# Patient Record
Sex: Female | Born: 1985 | Race: Asian | Hispanic: No | Marital: Married | Smoking: Never smoker
Health system: Southern US, Community
[De-identification: ages and names within clinical notes are randomized; demographics above are authoritative.]

## PROBLEM LIST (undated history)

## (undated) DIAGNOSIS — I10 Essential (primary) hypertension: Secondary | ICD-10-CM

## (undated) DIAGNOSIS — O24419 Gestational diabetes mellitus in pregnancy, unspecified control: Secondary | ICD-10-CM

## (undated) DIAGNOSIS — B999 Unspecified infectious disease: Secondary | ICD-10-CM

## (undated) HISTORY — DX: Essential (primary) hypertension: I10

---

## 2018-11-11 DIAGNOSIS — I10 Essential (primary) hypertension: Secondary | ICD-10-CM | POA: Insufficient documentation

## 2018-12-27 ENCOUNTER — Ambulatory Visit (INDEPENDENT_AMBULATORY_CARE_PROVIDER_SITE_OTHER): Payer: Medicaid Other | Admitting: Obstetrics and Gynecology

## 2018-12-27 ENCOUNTER — Encounter: Payer: Self-pay | Admitting: Obstetrics and Gynecology

## 2018-12-27 DIAGNOSIS — O10919 Unspecified pre-existing hypertension complicating pregnancy, unspecified trimester: Secondary | ICD-10-CM | POA: Insufficient documentation

## 2018-12-27 DIAGNOSIS — Z23 Encounter for immunization: Secondary | ICD-10-CM

## 2018-12-27 DIAGNOSIS — O10911 Unspecified pre-existing hypertension complicating pregnancy, first trimester: Secondary | ICD-10-CM | POA: Diagnosis not present

## 2018-12-27 DIAGNOSIS — O0991 Supervision of high risk pregnancy, unspecified, first trimester: Secondary | ICD-10-CM

## 2018-12-27 DIAGNOSIS — Z3481 Encounter for supervision of other normal pregnancy, first trimester: Secondary | ICD-10-CM

## 2018-12-27 DIAGNOSIS — O0990 Supervision of high risk pregnancy, unspecified, unspecified trimester: Secondary | ICD-10-CM

## 2018-12-27 MED ORDER — PROGESTERONE MICRONIZED 200 MG PO CAPS: 200.0000 mg | ORAL_CAPSULE | Freq: Every day | ORAL | 0 refills | Status: DC

## 2018-12-27 MED ORDER — LABETALOL HCL 100 MG PO TABS: 100.0000 mg | ORAL_TABLET | Freq: Two times a day (BID) | ORAL | 4 refills | Status: DC

## 2018-12-27 MED ORDER — ASPIRIN EC 81 MG PO TBEC: 81.0000 mg | DELAYED_RELEASE_TABLET | Freq: Every day | ORAL | 2 refills | Status: DC

## 2018-12-27 MED ORDER — PRENATAL 27-0.8 MG PO TABS: 1.0000 | ORAL_TABLET | Freq: Every day | ORAL | 11 refills | Status: AC

## 2018-12-27 NOTE — Patient Instructions (Signed)
 First Trimester of Pregnancy The first trimester of pregnancy is from week 1 until the end of week 13 (months 1 through 3). A week after a sperm fertilizes an egg, the egg will implant on the wall of the uterus. This embryo will begin to develop into a baby. Genes from you and your partner will form the baby. The female genes will determine whether the baby will be a boy or a girl. At 6-8 weeks, the eyes and face will be formed, and the heartbeat can be seen on ultrasound. At the end of 12 weeks, all the baby's organs will be formed. Now that you are pregnant, you will want to do everything you can to have a healthy baby. Two of the most important things are to get good prenatal care and to follow your health care provider's instructions. Prenatal care is all the medical care you receive before the baby's birth. This care will help prevent, find, and treat any problems during the pregnancy and childbirth. Body changes during your first trimester Your body goes through many changes during pregnancy. The changes vary from woman to woman.  You may gain or lose a couple of pounds at first.  You may feel sick to your stomach (nauseous) and you may throw up (vomit). If the vomiting is uncontrollable, call your health care provider.  You may tire easily.  You may develop headaches that can be relieved by medicines. All medicines should be approved by your health care provider.  You may urinate more often. Painful urination may mean you have a bladder infection.  You may develop heartburn as a result of your pregnancy.  You may develop constipation because certain hormones are causing the muscles that push stool through your intestines to slow down.  You may develop hemorrhoids or swollen veins (varicose veins).  Your breasts may begin to grow larger and become tender. Your nipples may stick out more, and the tissue that surrounds them (areola) may become darker.  Your gums may bleed and may be  sensitive to brushing and flossing.  Dark spots or blotches (chloasma, mask of pregnancy) may develop on your face. This will likely fade after the baby is born.  Your menstrual periods will stop.  You may have a loss of appetite.  You may develop cravings for certain kinds of food.  You may have changes in your emotions from day to day, such as being excited to be pregnant or being concerned that something may go wrong with the pregnancy and baby.  You may have more vivid and strange dreams.  You may have changes in your hair. These can include thickening of your hair, rapid growth, and changes in texture. Some women also have hair loss during or after pregnancy, or hair that feels dry or thin. Your hair will most likely return to normal after your baby is born. What to expect at prenatal visits During a routine prenatal visit:  You will be weighed to make sure you and the baby are growing normally.  Your blood pressure will be taken.  Your abdomen will be measured to track your baby's growth.  The fetal heartbeat will be listened to between weeks 10 and 14 of your pregnancy.  Test results from any previous visits will be discussed. Your health care provider may ask you:  How you are feeling.  If you are feeling the baby move.  If you have had any abnormal symptoms, such as leaking fluid, bleeding, severe headaches, or   abdominal cramping.  If you are using any tobacco products, including cigarettes, chewing tobacco, and electronic cigarettes.  If you have any questions. Other tests that may be performed during your first trimester include:  Blood tests to find your blood type and to check for the presence of any previous infections. The tests will also be used to check for low iron levels (anemia) and protein on red blood cells (Rh antibodies). Depending on your risk factors, or if you previously had diabetes during pregnancy, you may have tests to check for high blood sugar  that affects pregnant women (gestational diabetes).  Urine tests to check for infections, diabetes, or protein in the urine.  An ultrasound to confirm the proper growth and development of the baby.  Fetal screens for spinal cord problems (spina bifida) and Down syndrome.  HIV (human immunodeficiency virus) testing. Routine prenatal testing includes screening for HIV, unless you choose not to have this test.  You may need other tests to make sure you and the baby are doing well. Follow these instructions at home: Medicines  Follow your health care provider's instructions regarding medicine use. Specific medicines may be either safe or unsafe to take during pregnancy.  Take a prenatal vitamin that contains at least 600 micrograms (mcg) of folic acid.  If you develop constipation, try taking a stool softener if your health care provider approves. Eating and drinking   Eat a balanced diet that includes fresh fruits and vegetables, whole grains, good sources of protein such as meat, eggs, or tofu, and low-fat dairy. Your health care provider will help you determine the amount of weight gain that is right for you.  Avoid raw meat and uncooked cheese. These carry germs that can cause birth defects in the baby.  Eating four or five small meals rather than three large meals a day may help relieve nausea and vomiting. If you start to feel nauseous, eating a few soda crackers can be helpful. Drinking liquids between meals, instead of during meals, also seems to help ease nausea and vomiting.  Limit foods that are high in fat and processed sugars, such as fried and sweet foods.  To prevent constipation: ? Eat foods that are high in fiber, such as fresh fruits and vegetables, whole grains, and beans. ? Drink enough fluid to keep your urine clear or pale yellow. Activity  Exercise only as directed by your health care provider. Most women can continue their usual exercise routine during  pregnancy. Try to exercise for 30 minutes at least 5 days a week. Exercising will help you: ? Control your weight. ? Stay in shape. ? Be prepared for labor and delivery.  Experiencing pain or cramping in the lower abdomen or lower back is a good sign that you should stop exercising. Check with your health care provider before continuing with normal exercises.  Try to avoid standing for long periods of time. Move your legs often if you must stand in one place for a long time.  Avoid heavy lifting.  Wear low-heeled shoes and practice good posture.  You may continue to have sex unless your health care provider tells you not to. Relieving pain and discomfort  Wear a good support bra to relieve breast tenderness.  Take warm sitz baths to soothe any pain or discomfort caused by hemorrhoids. Use hemorrhoid cream if your health care provider approves.  Rest with your legs elevated if you have leg cramps or low back pain.  If you develop varicose veins   in your legs, wear support hose. Elevate your feet for 15 minutes, 3-4 times a day. Limit salt in your diet. Prenatal care  Schedule your prenatal visits by the twelfth week of pregnancy. They are usually scheduled monthly at first, then more often in the last 2 months before delivery.  Write down your questions. Take them to your prenatal visits.  Keep all your prenatal visits as told by your health care provider. This is important. Safety  Wear your seat belt at all times when driving.  Make a list of emergency phone numbers, including numbers for family, friends, the hospital, and police and fire departments. General instructions  Ask your health care provider for a referral to a local prenatal education class. Begin classes no later than the beginning of month 6 of your pregnancy.  Ask for help if you have counseling or nutritional needs during pregnancy. Your health care provider can offer advice or refer you to specialists for help  with various needs.  Do not use hot tubs, steam rooms, or saunas.  Do not douche or use tampons or scented sanitary pads.  Do not cross your legs for long periods of time.  Avoid cat litter boxes and soil used by cats. These carry germs that can cause birth defects in the baby and possibly loss of the fetus by miscarriage or stillbirth.  Avoid all smoking, herbs, alcohol, and medicines not prescribed by your health care provider. Chemicals in these products affect the formation and growth of the baby.  Do not use any products that contain nicotine or tobacco, such as cigarettes and e-cigarettes. If you need help quitting, ask your health care provider. You may receive counseling support and other resources to help you quit.  Schedule a dentist appointment. At home, brush your teeth with a soft toothbrush and be gentle when you floss. Contact a health care provider if:  You have dizziness.  You have mild pelvic cramps, pelvic pressure, or nagging pain in the abdominal area.  You have persistent nausea, vomiting, or diarrhea.  You have a bad smelling vaginal discharge.  You have pain when you urinate.  You notice increased swelling in your face, hands, legs, or ankles.  You are exposed to fifth disease or chickenpox.  You are exposed to [place] measles (rubella) and have never had it. Get help right away if:  You have a fever.  You are leaking fluid from your vagina.  You have spotting or bleeding from your vagina.  You have severe abdominal cramping or pain.  You have rapid weight gain or loss.  You vomit blood or material that looks like coffee grounds.  You develop a severe headache.  You have shortness of breath.  You have any kind of trauma, such as from a fall or a car accident. Summary  The first trimester of pregnancy is from week 1 until the end of week 13 (months 1 through 3).  Your body goes through many changes during pregnancy. The changes vary from  woman to woman.  You will have routine prenatal visits. During those visits, your health care provider will examine you, discuss any test results you may have, and talk with you about how you are feeling. This information is not intended to replace advice given to you by your health care provider. Make sure you discuss any questions you have with your health care provider. Document Released: 11/18/2001 Document Revised: 11/05/2016 Document Reviewed: 11/05/2016 Elsevier Interactive Patient Education  2019 [employer].  Second Trimester of Pregnancy The second trimester is from week 14 through week 27 (months 4 through 6). The second trimester is often a time when you feel your best. Your body has adjusted to being pregnant, and you begin to feel better physically. Usually, morning sickness has lessened or quit completely, you may have more energy, and you may have an increase in appetite. The second trimester is also a time when the fetus is growing rapidly. At the end of the sixth month, the fetus is about 9 inches long and weighs about 1 pounds. You will likely begin to feel the baby move (quickening) between 16 and 20 weeks of pregnancy. Body changes during your second trimester Your body continues to go through many changes during your second trimester. The changes vary from woman to woman.  Your weight will continue to increase. You will notice your lower abdomen bulging out.  You may begin to get stretch marks on your hips, abdomen, and breasts.  You may develop headaches that can be relieved by medicines. The medicines should be approved by your health care provider.  You may urinate more often because the fetus is pressing on your bladder.  You may develop or continue to have heartburn as a result of your pregnancy.  You may develop constipation because certain hormones are causing the muscles that push waste through your intestines to slow down.  You may develop hemorrhoids or  swollen, bulging veins (varicose veins).  You may have back pain. This is caused by: ? Weight gain. ? Pregnancy hormones that are relaxing the joints in your pelvis. ? A shift in weight and the muscles that support your balance.  Your breasts will continue to grow and they will continue to become tender.  Your gums may bleed and may be sensitive to brushing and flossing.  Dark spots or blotches (chloasma, mask of pregnancy) may develop on your face. This will likely fade after the baby is born.  A dark line from your belly button to the pubic area (linea nigra) may appear. This will likely fade after the baby is born.  You may have changes in your hair. These can include thickening of your hair, rapid growth, and changes in texture. Some women also have hair loss during or after pregnancy, or hair that feels dry or thin. Your hair will most likely return to normal after your baby is born. What to expect at prenatal visits During a routine prenatal visit:  You will be weighed to make sure you and the fetus are growing normally.  Your blood pressure will be taken.  Your abdomen will be measured to track your baby's growth.  The fetal heartbeat will be listened to.  Any test results from the previous visit will be discussed. Your health care provider may ask you:  How you are feeling.  If you are feeling the baby move.  If you have had any abnormal symptoms, such as leaking fluid, bleeding, severe headaches, or abdominal cramping.  If you are using any tobacco products, including cigarettes, chewing tobacco, and electronic cigarettes.  If you have any questions. Other tests that may be performed during your second trimester include:  Blood tests that check for: ? Low iron levels (anemia). ? High blood sugar that affects pregnant women (gestational diabetes) between 26 and 28 weeks. ? Rh antibodies. This is to check for a protein on red blood cells (Rh factor).  Urine tests  to check for infections, diabetes, or protein in  the urine.  An ultrasound to confirm the proper growth and development of the baby.  An amniocentesis to check for possible genetic problems.  Fetal screens for spina bifida and Down syndrome.  HIV (human immunodeficiency virus) testing. Routine prenatal testing includes screening for HIV, unless you choose not to have this test. Follow these instructions at home: Medicines  Follow your health care provider's instructions regarding medicine use. Specific medicines may be either safe or unsafe to take during pregnancy.  Take a prenatal vitamin that contains at least 600 micrograms (mcg) of folic acid.  If you develop constipation, try taking a stool softener if your health care provider approves. Eating and drinking   Eat a balanced diet that includes fresh fruits and vegetables, whole grains, good sources of protein such as meat, eggs, or tofu, and low-fat dairy. Your health care provider will help you determine the amount of weight gain that is right for you.  Avoid raw meat and uncooked cheese. These carry germs that can cause birth defects in the baby.  If you have low calcium intake from food, talk to your health care provider about whether you should take a daily calcium supplement.  Limit foods that are high in fat and processed sugars, such as fried and sweet foods.  To prevent constipation: ? Drink enough fluid to keep your urine clear or pale yellow. ? Eat foods that are high in fiber, such as fresh fruits and vegetables, whole grains, and beans. Activity  Exercise only as directed by your health care provider. Most women can continue their usual exercise routine during pregnancy. Try to exercise for 30 minutes at least 5 days a week. Stop exercising if you experience uterine contractions.  Avoid heavy lifting, wear low heel shoes, and practice good posture.  A sexual relationship may be continued unless your health care  provider directs you otherwise. Relieving pain and discomfort  Wear a good support bra to prevent discomfort from breast tenderness.  Take warm sitz baths to soothe any pain or discomfort caused by hemorrhoids. Use hemorrhoid cream if your health care provider approves.  Rest with your legs elevated if you have leg cramps or low back pain.  If you develop varicose veins, wear support hose. Elevate your feet for 15 minutes, 3-4 times a day. Limit salt in your diet. Prenatal Care  Write down your questions. Take them to your prenatal visits.  Keep all your prenatal visits as told by your health care provider. This is important. Safety  Wear your seat belt at all times when driving.  Make a list of emergency phone numbers, including numbers for family, friends, the hospital, and police and fire departments. General instructions  Ask your health care provider for a referral to a local prenatal education class. Begin classes no later than the beginning of month 6 of your pregnancy.  Ask for help if you have counseling or nutritional needs during pregnancy. Your health care provider can offer advice or refer you to specialists for help with various needs.  Do not use hot tubs, steam rooms, or saunas.  Do not douche or use tampons or scented sanitary pads.  Do not cross your legs for long periods of time.  Avoid cat litter boxes and soil used by cats. These carry germs that can cause birth defects in the baby and possibly loss of the fetus by miscarriage or stillbirth.  Avoid all smoking, herbs, alcohol, and unprescribed drugs. Chemicals in these products can affect the formation  and growth of the baby.  Do not use any products that contain nicotine or tobacco, such as cigarettes and e-cigarettes. If you need help quitting, ask your health care provider.  Visit your dentist if you have not gone yet during your pregnancy. Use a soft toothbrush to brush your teeth and be gentle when you  floss. Contact a health care provider if:  You have dizziness.  You have mild pelvic cramps, pelvic pressure, or nagging pain in the abdominal area.  You have persistent nausea, vomiting, or diarrhea.  You have a bad smelling vaginal discharge.  You have pain when you urinate. Get help right away if:  You have a fever.  You are leaking fluid from your vagina.  You have spotting or bleeding from your vagina.  You have severe abdominal cramping or pain.  You have rapid weight gain or weight loss.  You have shortness of breath with chest pain.  You notice sudden or extreme swelling of your face, hands, ankles, feet, or legs.  You have not felt your baby move in over an hour.  You have severe headaches that do not go away when you take medicine.  You have vision changes. Summary  The second trimester is from week 14 through week 27 (months 4 through 6). It is also a time when the fetus is growing rapidly.  Your body goes through many changes during pregnancy. The changes vary from woman to woman.  Avoid all smoking, herbs, alcohol, and unprescribed drugs. These chemicals affect the formation and growth your baby.  Do not use any tobacco products, such as cigarettes, chewing tobacco, and e-cigarettes. If you need help quitting, ask your health care provider.  Contact your health care provider if you have any questions. Keep all prenatal visits as told by your health care provider. This is important. This information is not intended to replace advice given to you by your health care provider. Make sure you discuss any questions you have with your health care provider. Document Released: 11/18/2001 Document Revised: 12/30/2016 Document Reviewed: 12/30/2016 Elsevier Interactive Patient Education  2019 [employer].   Contraception Choices Contraception, also called birth control, refers to methods or devices that prevent pregnancy. Hormonal methods Contraceptive  implant  A contraceptive implant is a thin, plastic tube that contains a hormone. It is inserted into the upper part of the arm. It can remain in place for up to 3 years. Progestin-only injections Progestin-only injections are injections of progestin, a synthetic form of the hormone progesterone. They are given every 3 months by a health care provider. Birth control pills  Birth control pills are pills that contain hormones that prevent pregnancy. They must be taken once a day, preferably at the same time each day. Birth control patch  The birth control patch contains hormones that prevent pregnancy. It is placed on the skin and must be changed once a week for three weeks and removed on the fourth week. A prescription is needed to use this method of contraception. Vaginal ring  A vaginal ring contains hormones that prevent pregnancy. It is placed in the vagina for three weeks and removed on the fourth week. After that, the process is repeated with a new ring. A prescription is needed to use this method of contraception. Emergency contraceptive Emergency contraceptives prevent pregnancy after unprotected sex. They come in pill form and can be taken up to 5 days after sex. They work best the sooner they are taken after having sex. Most emergency  contraceptives are available without a prescription. This method should not be used as your only form of birth control. Barrier methods Female condom  A female condom is a thin sheath that is worn over the penis during sex. Condoms keep sperm from going inside a woman's body. They can be used with a spermicide to increase their effectiveness. They should be disposed after a single use. Female condom  A female condom is a soft, loose-fitting sheath that is put into the vagina before sex. The condom keeps sperm from going inside a woman's body. They should be disposed after a single use. Diaphragm  A diaphragm is a soft, dome-shaped barrier. It is inserted  into the vagina before sex, along with a spermicide. The diaphragm blocks sperm from entering the uterus, and the spermicide kills sperm. A diaphragm should be left in the vagina for 6-8 hours after sex and removed within 24 hours. A diaphragm is prescribed and fitted by a health care provider. A diaphragm should be replaced every 1-2 years, after giving birth, after gaining more than 15 lb (6.8 kg), and after pelvic surgery. Cervical cap  A cervical cap is a round, soft latex or plastic cup that fits over the cervix. It is inserted into the vagina before sex, along with spermicide. It blocks sperm from entering the uterus. The cap should be left in place for 6-8 hours after sex and removed within 48 hours. A cervical cap must be prescribed and fitted by a health care provider. It should be replaced every 2 years. Sponge  A sponge is a soft, circular piece of polyurethane foam with spermicide on it. The sponge helps block sperm from entering the uterus, and the spermicide kills sperm. To use it, you make it wet and then insert it into the vagina. It should be inserted before sex, left in for at least 6 hours after sex, and removed and thrown away within 30 hours. Spermicides Spermicides are chemicals that kill or block sperm from entering the cervix and uterus. They can come as a cream, jelly, suppository, foam, or tablet. A spermicide should be inserted into the vagina with an applicator at least 16-96 minutes before sex to allow time for it to work. The process must be repeated every time you have sex. Spermicides do not require a prescription. Intrauterine contraception Intrauterine device (IUD) An IUD is a T-shaped device that is put in a woman's uterus. There are two types:  Hormone IUD.This type contains progestin, a synthetic form of the hormone progesterone. This type can stay in place for 3-5 years.  Copper IUD.This type is wrapped in copper wire. It can stay in place for 10  years.  Permanent methods of contraception Female tubal ligation In this method, a woman's fallopian tubes are sealed, tied, or blocked during surgery to prevent eggs from traveling to the uterus. Hysteroscopic sterilization In this method, a small, flexible insert is placed into each fallopian tube. The inserts cause scar tissue to form in the fallopian tubes and block them, so sperm cannot reach an egg. The procedure takes about 3 months to be effective. Another form of birth control must be used during those 3 months. Female sterilization This is a procedure to tie off the tubes that carry sperm (vasectomy). After the procedure, the man can still ejaculate fluid (semen). Natural planning methods Natural family planning In this method, a couple does not have sex on days when the woman could become pregnant. Calendar method This means keeping track  of the length of each menstrual cycle, identifying the days when pregnancy can happen, and not having sex on those days. Ovulation method In this method, a couple avoids sex during ovulation. Symptothermal method This method involves not having sex during ovulation. The woman typically checks for ovulation by watching changes in her temperature and in the consistency of cervical mucus. Post-ovulation method In this method, a couple waits to have sex until after ovulation. Summary  Contraception, also called birth control, means methods or devices that prevent pregnancy.  Hormonal methods of contraception include implants, injections, pills, patches, vaginal rings, and emergency contraceptives.  Barrier methods of contraception can include female condoms, female condoms, diaphragms, cervical caps, sponges, and spermicides.  There are two types of IUDs (intrauterine devices). An IUD can be put in a woman's uterus to prevent pregnancy for 3-5 years.  Permanent sterilization can be done through a procedure for males, females, or both.  Natural  family planning methods involve not having sex on days when the woman could become pregnant. This information is not intended to replace advice given to you by your health care provider. Make sure you discuss any questions you have with your health care provider. Document Released: 11/24/2005 Document Revised: 11/26/2017 Document Reviewed: 12/27/2016 Elsevier Interactive Patient Education  2019 [employer].   Postpartum Baby Blues The postpartum period begins right after the birth of a baby. During this time, there is often a lot of joy and excitement. It is also a time of many changes in the life of the parents. No matter how many times a mother gives birth, each child brings new challenges to the family, including different ways of relating to one another. It is common to have feelings of excitement along with confusing changes in moods, emotions, and thoughts. You may feel happy one minute and sad or stressed the next. These feelings of sadness usually happen in the period right after you have your baby, and they go away within a week or two. This is called the "baby blues." What are the causes? There is no known cause of baby blues. It is likely caused by a combination of factors. However, changes in hormone levels after childbirth are believed to trigger some of the symptoms. Other factors that can play a role in these mood changes include:  Lack of sleep.  Stressful life events, such as poverty, caring for a loved one, or death of a loved one.  Genetics. What are the signs or symptoms? Symptoms of this condition include:  Brief changes in mood, such as going from extreme happiness to sadness.  Decreased concentration.  Difficulty sleeping.  Crying spells and tearfulness.  Loss of appetite.  Irritability.  Anxiety. If the symptoms of baby blues last for more than 2 weeks or become more severe, you may have postpartum depression. How is this diagnosed? This condition is  diagnosed based on an evaluation of your symptoms. There are no medical or lab tests that lead to a diagnosis, but there are various questionnaires that a health care provider may use to identify women with the baby blues or postpartum depression. How is this treated? Treatment is not needed for this condition. The baby blues usually go away on their own in 1-2 weeks. Social support is often all that is needed. You will be encouraged to get adequate sleep and rest. Follow these instructions at home: Lifestyle      Get as much rest as you can. Take a nap when the baby  sleeps.  Exercise regularly as told by your health care provider. Some women find yoga and walking to be helpful.  Eat a balanced and nourishing diet. This includes plenty of fruits and vegetables, whole grains, and lean proteins.  Do little things that you enjoy. Have a cup of tea, take a bubble bath, read your favorite magazine, or listen to your favorite music.  Avoid alcohol.  Ask for help with household chores, cooking, grocery shopping, or running errands. Do not try to do everything yourself. Consider hiring a postpartum doula to help. This is a professional who specializes in providing support to new mothers.  Try not to make any major life changes during pregnancy or right after giving birth. This can add stress. General instructions  Talk to people close to you about how you are feeling. Get support from your partner, family members, friends, or other new moms. You may want to join a support group.  Find ways to cope with stress. This may include: ? Writing your thoughts and feelings in a journal. ? Spending time outside. ? Spending time with people who make you laugh.  Try to stay positive in how you think. Think about the things you are grateful for.  Take over-the-counter and prescription medicines only as told by your health care provider.  Let your health care provider know if you have any  concerns.  Keep all postpartum visits as told by your health care provider. This is important. Contact a health care provider if:  Your baby blues do not go away after 2 weeks. Get help right away if:  You have thoughts of taking your own life (suicidal thoughts).  You think you may harm the baby or other people.  You see or hear things that are not there (hallucinations). Summary  After giving birth, you may feel happy one minute and sad or stressed the next. Feelings of sadness that happen right after the baby is born and go away after a week or two are called the "baby blues."  You can manage the baby blues by getting enough rest, eating a healthy diet, exercising, spending time with supportive people, and finding ways to cope with stress.  If feelings of sadness and stress last longer than 2 weeks or get in the way of caring for your baby, talk to your health care provider. This may mean you have postpartum depression. This information is not intended to replace advice given to you by your health care provider. Make sure you discuss any questions you have with your health care provider. Document Released: 08/28/2004 Document Revised: 01/20/2017 Document Reviewed: 01/20/2017 Elsevier Interactive Patient Education  2019 [employer].

## 2018-12-27 NOTE — Progress Notes (Signed)
  Subjective:    Mary Donaldson is a G1P0 [redacted]w[redacted]d being seen today for her first obstetrical visit. Patient had one initial visit with ultrasound in [place] before relocating to [place]. Her obstetrical history is significant for CHTN on labetalol. Patient does intend to breast feed. Pregnancy history fully reviewed.  Patient reports no complaints.  Vitals:   12/27/18 1406 12/27/18 1407  BP: 122/86   Pulse: (!) 116   Weight: 158 lb 9.6 oz (71.9 kg)   Height:  5\' 3"  (1.6 m)    HISTORY: OB History  Gravida Para Term Preterm AB Living  1            SAB TAB Ectopic Multiple Live Births               # Outcome Date GA Lbr Len/2nd Weight Sex Delivery Anes PTL Lv  1 Current            Past Medical History:  Diagnosis Date  . Hypertension    History reviewed. No pertinent surgical history. Family History  Problem Relation Age of Onset  . Diabetes Mother   . Hypertension Mother      Exam      Assessment:    Pregnancy: G1P0 Patient Active Problem List   Diagnosis Date Noted  . Supervision of high risk pregnancy, antepartum 12/27/2018  . Chronic hypertension affecting pregnancy 12/27/2018        Plan:     Initial labs drawn. Prenatal vitamins. Problem list reviewed and updated. Genetic Screening discussed : panorama ordered.  Ultrasound discussed; fetal survey: requested. Rx ASA provided Baseline labs ordered Continue labetalol Patient was provided a Rx prometrium from previous OB to take until 14 weeks- refill provided  Follow up in 4 weeks. 50% of 30 min visit spent on counseling and coordination of care.     Ianmichael Amescua 12/27/2018

## 2018-12-27 NOTE — Progress Notes (Signed)
Pt is here for initial ROB. Transfer from [facility].

## 2018-12-28 LAB — PROTEIN / CREATININE RATIO, URINE
Creatinine, Urine: 258.5 mg/dL
Protein, Ur: 31 mg/dL
Protein/Creat Ratio: 120 mg/g creat (ref 0–200)

## 2019-01-01 ENCOUNTER — Emergency Department (HOSPITAL_COMMUNITY): Payer: Medicaid Other

## 2019-01-01 ENCOUNTER — Emergency Department (HOSPITAL_BASED_OUTPATIENT_CLINIC_OR_DEPARTMENT_OTHER)
Admission: AD | Admit: 2019-01-01 | Discharge: 2019-01-01 | Disposition: A | Discharge status: Home / Self Care | Payer: Medicaid Other | Attending: Obstetrics & Gynecology | Admitting: Obstetrics & Gynecology

## 2019-01-01 ENCOUNTER — Encounter (HOSPITAL_BASED_OUTPATIENT_CLINIC_OR_DEPARTMENT_OTHER): Payer: Self-pay | Admitting: Emergency Medicine

## 2019-01-01 ENCOUNTER — Other Ambulatory Visit: Payer: Self-pay

## 2019-01-01 DIAGNOSIS — R102 Pelvic and perineal pain: Secondary | ICD-10-CM

## 2019-01-01 DIAGNOSIS — N9089 Other specified noninflammatory disorders of vulva and perineum: Secondary | ICD-10-CM

## 2019-01-01 DIAGNOSIS — Z3A11 11 weeks gestation of pregnancy: Secondary | ICD-10-CM | POA: Diagnosis not present

## 2019-01-01 DIAGNOSIS — D509 Iron deficiency anemia, unspecified: Secondary | ICD-10-CM | POA: Diagnosis present

## 2019-01-01 DIAGNOSIS — O0990 Supervision of high risk pregnancy, unspecified, unspecified trimester: Secondary | ICD-10-CM

## 2019-01-01 DIAGNOSIS — O26891 Other specified pregnancy related conditions, first trimester: Secondary | ICD-10-CM

## 2019-01-01 DIAGNOSIS — O468X1 Other antepartum hemorrhage, first trimester: Secondary | ICD-10-CM

## 2019-01-01 DIAGNOSIS — O209 Hemorrhage in early pregnancy, unspecified: Secondary | ICD-10-CM

## 2019-01-01 DIAGNOSIS — O99011 Anemia complicating pregnancy, first trimester: Secondary | ICD-10-CM | POA: Diagnosis not present

## 2019-01-01 DIAGNOSIS — O418X10 Other specified disorders of amniotic fluid and membranes, first trimester, not applicable or unspecified: Secondary | ICD-10-CM | POA: Diagnosis not present

## 2019-01-01 DIAGNOSIS — Z6790 Unspecified blood type, Rh positive: Secondary | ICD-10-CM

## 2019-01-01 DIAGNOSIS — O10919 Unspecified pre-existing hypertension complicating pregnancy, unspecified trimester: Secondary | ICD-10-CM

## 2019-01-01 DIAGNOSIS — D72829 Elevated white blood cell count, unspecified: Secondary | ICD-10-CM | POA: Insufficient documentation

## 2019-01-01 DIAGNOSIS — O161 Unspecified maternal hypertension, first trimester: Secondary | ICD-10-CM | POA: Diagnosis not present

## 2019-01-01 LAB — CBC WITH DIFFERENTIAL/PLATELET
Abs Immature Granulocytes: 0.41 10*3/uL — ABNORMAL HIGH (ref 0.00–0.07)
BASOS PCT: 0 %
Basophils Absolute: 0.1 10*3/uL (ref 0.0–0.1)
Eosinophils Absolute: 0.2 10*3/uL (ref 0.0–0.5)
Eosinophils Relative: 1 %
HCT: 31.2 % — ABNORMAL LOW (ref 36.0–46.0)
HEMOGLOBIN: 9.6 g/dL — AB (ref 12.0–15.0)
Immature Granulocytes: 2 %
Lymphocytes Relative: 15 %
Lymphs Abs: 3.3 10*3/uL (ref 0.7–4.0)
MCH: 18.6 pg — ABNORMAL LOW (ref 26.0–34.0)
MCHC: 30.8 g/dL (ref 30.0–36.0)
MCV: 60.6 fL — ABNORMAL LOW (ref 80.0–100.0)
MONO ABS: 1.3 10*3/uL — AB (ref 0.1–1.0)
MONOS PCT: 6 %
Neutro Abs: 16.5 10*3/uL — ABNORMAL HIGH (ref 1.7–7.7)
Neutrophils Relative %: 76 %
Platelets: 332 10*3/uL (ref 150–400)
RBC: 5.15 MIL/uL — ABNORMAL HIGH (ref 3.87–5.11)
RDW: 19.8 % — ABNORMAL HIGH (ref 11.5–15.5)
Smear Review: NORMAL
WBC: 21.7 10*3/uL — ABNORMAL HIGH (ref 4.0–10.5)
nRBC: 0 % (ref 0.0–0.2)

## 2019-01-01 LAB — WET PREP, GENITAL
Sperm: NONE SEEN
Trich, Wet Prep: NONE SEEN
YEAST WET PREP: NONE SEEN

## 2019-01-01 LAB — URINALYSIS, ROUTINE W REFLEX MICROSCOPIC
Bilirubin Urine: NEGATIVE
Glucose, UA: NEGATIVE mg/dL
Hgb urine dipstick: NEGATIVE
Ketones, ur: NEGATIVE mg/dL
Leukocytes, UA: NEGATIVE
Nitrite: NEGATIVE
Protein, ur: NEGATIVE mg/dL
Specific Gravity, Urine: 1.016 (ref 1.005–1.030)
pH: 8 (ref 5.0–8.0)

## 2019-01-01 LAB — ABO/RH: ABO/RH(D): A POS

## 2019-01-01 LAB — HCG, QUANTITATIVE, PREGNANCY: hCG, Beta Chain, Quant, S: 75317 m[IU]/mL — ABNORMAL HIGH (ref <5)

## 2019-01-01 MED ORDER — LACTATED RINGERS IV SOLN
INTRAVENOUS | Status: DC
  Administered 2019-01-01: 06:00:00 via INTRAVENOUS

## 2019-01-01 NOTE — ED Notes (Signed)
Report given to [facility]. ETA 20 mins

## 2019-01-01 NOTE — MAU Note (Signed)
Pt arrives via [facility]  Reports she started spotting yesterday. Today it is heavier but still only seeing the bleeding on the toilet paper  Pimple on left side labia  Started cramping yesterday and was worse today

## 2019-01-01 NOTE — Discharge Instructions (Signed)
Subchorionic Hematoma ° °A subchorionic hematoma is a gathering of blood between the outer wall of the embryo (chorion) and the inner wall of the womb (uterus). °This condition can cause vaginal bleeding. If they cause little or no vaginal bleeding, early small hematomas usually shrink on their own and do not affect your baby or pregnancy. When bleeding starts later in pregnancy, or if the hematoma is larger or occurs in older pregnant women, the condition may be more serious. Larger hematomas may get bigger, which increases the chances of miscarriage. This condition also increases the risk of: °· Premature separation of the placenta from the uterus. °· Premature (preterm) labor. °· Stillbirth. °What are the causes? °The exact cause of this condition is not known. It occurs when blood is trapped between the placenta and the uterine wall because the placenta has separated from the original site of implantation. °What increases the risk? °You are more likely to develop this condition if: °· You were treated with fertility medicines. °· You conceived through in vitro fertilization (IVF). °What are the signs or symptoms? °Symptoms of this condition include: °· Vaginal spotting or bleeding. °· Contractions of the uterus. These cause abdominal pain. °Sometimes you may have no symptoms and the bleeding may only be seen when ultrasound images are taken (transvaginal ultrasound). °How is this diagnosed? °This condition is diagnosed based on a physical exam. This includes a pelvic exam. You may also have other tests, including: °· Blood tests. °· Urine tests. °· Ultrasound of the abdomen. °How is this treated? °Treatment for this condition can vary. Treatment may include: °· Watchful waiting. You will be monitored closely for any changes in bleeding. During this stage: °? The hematoma may be reabsorbed by the body. °? The hematoma may separate the fluid-filled space containing the embryo (gestational sac) from the wall of the  womb (endometrium). °· Medicines. °· Activity restriction. This may be needed until the bleeding stops. °Follow these instructions at home: °· Stay on bed rest if told to do so by your health care provider. °· Do not lift anything that is heavier than 10 lbs. (4.5 kg) or as told by your health care provider. °· Do not use any products that contain nicotine or tobacco, such as cigarettes and e-cigarettes. If you need help quitting, ask your health care provider. °· Track and write down the number of pads you use each day and how soaked (saturated) they are. °· Do not use tampons. °· Keep all follow-up visits as told by your health care provider. This is important. Your health care provider may ask you to have follow-up blood tests or ultrasound tests or both. °Contact a health care provider if: °· You have any vaginal bleeding. °· You have a fever. °Get help right away if: °· You have severe cramps in your stomach, back, abdomen, or pelvis. °· You pass large clots or tissue. Save any tissue for your health care provider to look at. °· You have more vaginal bleeding, and you faint or become lightheaded or weak. °Summary °· A subchorionic hematoma is a gathering of blood between the outer wall of the placenta and the uterus. °· This condition can cause vaginal bleeding. °· Sometimes you may have no symptoms and the bleeding may only be seen when ultrasound images are taken. °· Treatment may include watchful waiting, medicines, or activity restriction. °This information is not intended to replace advice given to you by your health care provider. Make sure you discuss any questions you   have with your health care provider. °Document Released: 03/11/2007 Document Revised: 01/20/2017 Document Reviewed: 01/20/2017 °Elsevier Interactive Patient Education © 2019 Elsevier Inc. ° °

## 2019-01-01 NOTE — ED Triage Notes (Signed)
Patient presents with co abscess to vaginal area; states onset this evening.

## 2019-01-01 NOTE — ED Provider Notes (Signed)
[facility] EMERGENCY DEPARTMENT Provider Note   CSN: [id number] Arrival date & time: 01/01/19  0455     History   Chief Complaint Chief Complaint  Patient presents with  . Abscess    HPI Mary Donaldson is a 33 y.o. female.  The history is provided by the patient.  Abscess  She is pregnant [redacted] weeks 4 days, G1 P0, and comes in with vaginal bleeding as well as cyst on her labia.  She had her initial prenatal exam on January 20.  She states that she had been having some intermittent spotting throughout the pregnancy, but started having bleeding and cramping tonight.  Past Medical History:  Diagnosis Date  . Hypertension     Patient Active Problem List   Diagnosis Date Noted  . Supervision of high risk pregnancy, antepartum 12/27/2018  . Chronic hypertension affecting pregnancy 12/27/2018    History reviewed. No pertinent surgical history.   OB History    Gravida  1   Para      Term      Preterm      AB      Living        SAB      TAB      Ectopic      Multiple      Live Births               Home Medications    Prior to Admission medications   Medication Sig Start Date End Date Taking? Authorizing Provider  aspirin EC 81 MG tablet Take 1 tablet (81 mg total) by mouth daily. Take after 12 weeks for prevention of preeclampsia later in pregnancy 12/27/18   Constant, Peggy, MD  labetalol (NORMODYNE) 100 MG tablet Take 1 tablet (100 mg total) by mouth 2 (two) times daily. 12/27/18   Constant, Peggy, MD  ondansetron (ZOFRAN) 4 MG tablet Take by mouth. 11/11/18   [provider]  Prenatal Vit-Fe Fumarate-FA (MULTIVITAMIN-PRENATAL) 27-0.8 MG TABS tablet Take 1 tablet by mouth daily at 12 noon. 12/27/18   Constant, Peggy, MD  progesterone (PROMETRIUM) 200 MG capsule Take 1 capsule (200 mg total) by mouth daily. 12/27/18   Constant, Peggy, MD    Family History Family History  Problem Relation Age of Onset  . Diabetes Mother   .  Hypertension Mother     Social History Social History   Tobacco Use  . Smoking status: Never Smoker  . Smokeless tobacco: Never Used  Substance Use Topics  . Alcohol use: Never    Frequency: Never  . Drug use: Never     Allergies   Patient has no known allergies.   Review of Systems Review of Systems  All other systems reviewed and are negative.    Physical Exam Updated Vital Signs BP 130/89 (BP Location: Right Arm)   Pulse (!) 103   Temp 98.5 F (36.9 C) (Oral)   Resp 18   Ht 5\' 3"  (1.6 m)   Wt 72 kg   LMP 09/30/2018   SpO2 100%   BMI 28.12 kg/m   Physical Exam Vitals signs and nursing note reviewed.    34 year old female, resting comfortably and in no acute distress. Vital signs are normal. Oxygen saturation is 100%, which is normal. Head is normocephalic and atraumatic. PERRLA, EOMI. Oropharynx is clear. Neck is nontender and supple without adenopathy or JVD. Back is nontender and there is no CVA tenderness. Lungs are clear without rales, wheezes, or rhonchi.  Chest is nontender. Heart has regular rate and rhythm without murmur. Abdomen is soft, flat, with mild to moderate suprapubic tenderness.  There are no masses or hepatosplenomegaly and peristalsis is normoactive. Pelvic: Normal external female genitalia.  Generalized tenderness of the labia with obvious cyst or abscess.  Small to moderate amount of blood present in the vaginal vault.  Marked tenderness on speculum exam.  Unable to clearly visualize cervical loss.  On bimanual exam, marked tenderness diffusely, especially on simple palpation of the cervix.  Fundus approximately 8 weeks size, but difficult to assess due to patient's voluntary guarding. Extremities have no cyanosis or edema, full range of motion is present. Skin is warm and dry without rash. Neurologic: Mental status is normal, cranial nerves are intact, there are no motor or sensory deficits.  ED Treatments / Results  Labs (all labs  ordered are listed, but only abnormal results are displayed) Labs Reviewed  WET PREP, GENITAL - Abnormal; Notable for the following components:      Result Value   Clue Cells Wet Prep HPF POC PRESENT (*)    WBC, Wet Prep HPF POC MANY (*)    All other components within normal limits  CBC WITH DIFFERENTIAL/PLATELET  HCG, QUANTITATIVE, PREGNANCY  RPR  HIV ANTIBODY (ROUTINE TESTING W REFLEX)  ABO/RH  GC/CHLAMYDIA PROBE AMP ([place]) NOT AT [facility]    Procedures Procedures  Medications Ordered in ED Medications  lactated ringers infusion (has no administration in time range)     Initial Impression / Assessment and Plan / ED Course  I have reviewed the triage vital signs and the nursing notes.  Pertinent lab results that were available during my care of the patient were reviewed by me and considered in my medical decision making (see chart for details).  First trimester bleeding.  Unfortunately, ultrasound is not available at this location today.  [facility].  Old records are reviewed confirming initial prenatal visit on January 20, screening labs obtained at that point.  No blood type noted in her  medical record, but she is noted to be blood type A positive and blood test on November [facility], [facility].  Pelvic exam shows marked tenderness, small to moderate amount of blood present in the vaginal vault.  Case is discussed with Dr. Jolayne Pantheronstant, on-call for OB/GYN who agrees to accept the patient in transfer [facility].  She requests patient have an IV started and come by ambulance.  IV is started with lactated Ringer's.  Hemoglobin has come back at 9.6.  On care everywhere, hemoglobin had been 10.0 on January 7.  Leukocytosis of 21.7 is of uncertain significance.  Wet prep is positive for clue cells, but she does not clinically have bacterial vaginosis.   Quantitative hCG is pending at this time.  Final Clinical Impressions(s) / ED Diagnoses   Final diagnoses:  First trimester bleeding  Pelvic pain affecting pregnancy in first trimester, antepartum  Microcytic anemia    ED Discharge Orders    None       Dione BoozeGlick, Kortnie Stovall, MD 01/01/19 [phone]

## 2019-01-01 NOTE — ED Notes (Addendum)
[facility] notified (Tammy) - patient ready for transport.  She will call back to get information.  [facility] will not have transportation until after 7:30 am - Dr. Preston Fleeting requested EMS for transport.  EMS contacted.  EMS could not give ETA, so [facility] was also put on call for transport.  EMS called and stated that their volume was too high and we would have to wait for [facility].  Advised Dr. Preston Fleeting of this.

## 2019-01-01 NOTE — MAU Provider Note (Addendum)
History     CSN: [id number]  Arrival date and time: 01/01/19 0455   First Provider Initiated Contact with Patient 01/01/19 [phone]      Chief Complaint  Patient presents with  . Abscess  . Abdominal Pain  . Vaginal Bleeding   Mary Donaldson is a 33 y.o. G1P0 at [redacted]w[redacted]d presenting for vaginal bleeding and mild cramping (pain=3/10). Has not taken anything for the cramping. She states the vaginal bleeding began yesterday morning and was heavier with one "lentil" sized clot. Bleeding has slowed down today, with only pink spotting when she wipes. She also has a "pimple" on her left labia that is very painful when she wipes. Previous PNC in [place], moved here very recently.   OB History    Gravida  1   Para      Term      Preterm      AB      Living        SAB      TAB      Ectopic      Multiple      Live Births              Past Medical History:  Diagnosis Date  . Hypertension     History reviewed. No pertinent surgical history.  Family History  Problem Relation Age of Onset  . Diabetes Mother   . Hypertension Mother     Social History   Tobacco Use  . Smoking status: Never Smoker  . Smokeless tobacco: Never Used  Substance Use Topics  . Alcohol use: Never    Frequency: Never  . Drug use: Never    Allergies: No Known Allergies  Medications Prior to Admission  Medication Sig Dispense Refill Last Dose  . aspirin EC 81 MG tablet Take 1 tablet (81 mg total) by mouth daily. Take after 12 weeks for prevention of preeclampsia later in pregnancy 300 tablet 2   . labetalol (NORMODYNE) 100 MG tablet Take 1 tablet (100 mg total) by mouth 2 (two) times daily. 60 tablet 4   . ondansetron (ZOFRAN) 4 MG tablet Take by mouth.   Taking  . Prenatal Vit-Fe Fumarate-FA (MULTIVITAMIN-PRENATAL) 27-0.8 MG TABS tablet Take 1 tablet by mouth daily at 12 noon. 30 tablet 11   . progesterone (PROMETRIUM) 200 MG capsule Take 1 capsule (200 mg total) by mouth daily. 30 capsule  0     Review of Systems  Constitutional: Negative.   HENT: Negative.   Eyes: Negative.   Respiratory: Negative.  Negative for chest tightness and shortness of breath.   Cardiovascular: Negative.  Negative for chest pain.  Gastrointestinal: Negative.  Negative for abdominal distention, abdominal pain, constipation, diarrhea, nausea and vomiting.  Endocrine: Negative.   Genitourinary: Positive for dyspareunia (occasional based on diet), genital sores (on left labia), pelvic pain, vaginal bleeding (light pink to brown spotting) and vaginal pain (some tenderness). Negative for decreased urine volume, difficulty urinating, dysuria, flank pain, frequency, urgency and vaginal discharge.  Musculoskeletal: Negative.   Skin: Negative.   Allergic/Immunologic: Negative.   Neurological: Negative.  Negative for dizziness, light-headedness and headaches.  Hematological: Negative.   Psychiatric/Behavioral: Negative.    Physical Exam   Blood pressure 124/78, pulse (!) 102, temperature 98.4 F (36.9 C), temperature source Oral, resp. rate 18, height 5\' 3"  (1.6 m), weight 72 kg, last menstrual period 09/30/2018, SpO2 100 %.  Physical Exam  Nursing note and vitals reviewed. Constitutional: She is oriented to person, place,  and time. She appears well-developed and well-nourished. No distress.  HENT:  Head: Normocephalic.  Eyes: Pupils are equal, round, and reactive to light.  Neck: Normal range of motion.  Genitourinary:    There is tenderness and lesion on the left labia.    Vaginal tenderness (Tender to gentle palpation needed to insert small swabs) present.     No vaginal discharge.  There is tenderness (Tender to gentle palpation needed to insert small swabs) in the vagina.  Neurological: She is alert and oriented to person, place, and time.  Skin: Skin is warm and dry. She is not diaphoretic.  Psychiatric: She has a normal mood and affect. Her behavior is normal. Judgment and thought content  normal.   Results for orders placed or performed during the hospital encounter of 01/01/19 (from the past 24 hour(s))  CBC with Differential     Status: Abnormal   Collection Time: 01/01/19  5:43 AM  Result Value Ref Range   WBC 21.7 (H) 4.0 - 10.5 K/uL   RBC 5.15 (H) 3.87 - 5.11 MIL/uL   Hemoglobin 9.6 (L) 12.0 - 15.0 g/dL   HCT 25.931.2 (L) 56.336.0 - 87.546.0 %   MCV 60.6 (L) 80.0 - 100.0 fL   MCH 18.6 (L) 26.0 - 34.0 pg   MCHC 30.8 30.0 - 36.0 g/dL   RDW 64.319.8 (H) 32.911.5 - 51.815.5 %   Platelets 332 150 - 400 K/uL   nRBC 0.0 0.0 - 0.2 %   Neutrophils Relative % 76 %   Neutro Abs 16.5 (H) 1.7 - 7.7 K/uL   Lymphocytes Relative 15 %   Lymphs Abs 3.3 0.7 - 4.0 K/uL   Monocytes Relative 6 %   Monocytes Absolute 1.3 (H) 0.1 - 1.0 K/uL   Eosinophils Relative 1 %   Eosinophils Absolute 0.2 0.0 - 0.5 K/uL   Basophils Relative 0 %   Basophils Absolute 0.1 0.0 - 0.1 K/uL   WBC Morphology MORPHOLOGY UNREMARKABLE    Smear Review Normal platelet morphology    Immature Granulocytes 2 %   Abs Immature Granulocytes 0.41 (H) 0.00 - 0.07 K/uL   Tear Drop Cells PRESENT    Polychromasia PRESENT    Target Cells PRESENT    Spherocytes PRESENT    Ovalocytes PRESENT    Stomatocytes PRESENT   ABO/Rh     Status: None   Collection Time: 01/01/19  5:43 AM  Result Value Ref Range   ABO/RH(D)      A POS Performed at [facility], [address] [place], [place] [address]   hCG, quantitative, pregnancy     Status: Abnormal   Collection Time: 01/01/19  5:43 AM  Result Value Ref Range   hCG, Beta Chain, Quant, S 75,317 (H) <5 mIU/mL  Wet prep, genital     Status: Abnormal   Collection Time: 01/01/19  5:55 AM  Result Value Ref Range   Yeast Wet Prep HPF POC NONE SEEN NONE SEEN   Trich, Wet Prep NONE SEEN NONE SEEN   Clue Cells Wet Prep HPF POC PRESENT (A) NONE SEEN   WBC, Wet Prep HPF POC MANY (A) NONE SEEN   Sperm NONE SEEN   Urinalysis, Routine w reflex microscopic     Status: None   Collection Time:  01/01/19 11:25 AM  Result Value Ref Range   Color, Urine YELLOW YELLOW   APPearance CLEAR CLEAR   Specific Gravity, Urine 1.016 1.005 - 1.030   pH 8.0 5.0 - 8.0  Glucose, UA NEGATIVE NEGATIVE mg/dL   Hgb urine dipstick NEGATIVE NEGATIVE   Bilirubin Urine NEGATIVE NEGATIVE   Ketones, ur NEGATIVE NEGATIVE mg/dL   Protein, ur NEGATIVE NEGATIVE mg/dL   Nitrite NEGATIVE NEGATIVE   Leukocytes, UA NEGATIVE NEGATIVE   US Ob Comp Less 14 Wks  Result Date: 01/01/2019 CLINICAL DATA:  Vaginal bleeding EXAM: OBSTETRIC <14 WK ULTRASOUND TECHNIQUE: Transabdominal ultrasound was performed for evaluation of the gestation as well as the maternal uterus and adnexal regions. COMPARISON:  None. FINDINGS: Intrauterine gestational sac: Single Yolk sac:  Not seen Embryo:  Visualized. Cardiac Activity: Visualized. Heart Rate: 169 bpm MSD:    mm    w     d CRL:   58 mm   12 w 2 d                  [place] EDC: 07/14/2019 Subchorionic hemorrhage:  Small Maternal uterus/adnexae: Maternal ovaries appear normal and there is no mass or free fluid identified within either adnexal region. IMPRESSION: 1. Single live intrauterine pregnancy with estimated gestational age of [redacted] weeks and 2 days. 2. Small subchorionic hemorrhage. 3. Maternal ovaries appear normal and there is no mass or free fluid seen within either adnexal region. Electronically Signed   By: Bary Richard M.D.   On: 01/01/2019 11:01   MAU Course  Procedures  MDM CBC, UA, wet prep Pelvic ultrasound  Assessment and Plan  [redacted] weeks gestation [facility] Rh pos Vulvar lesion  Bernerd Limbo, SNM 01/01/2019, 9:34 AM   CNM attestation:  I have seen and examined this patient and agree with above documentation in the student's note.   Ilian Riling is a 33 y.o. G1P0 at [redacted]w[redacted]d reporting VB, cramping, and vaginal bump  ROS, labs, PMH reviewed  PE: Patient Vitals for the past 24 hrs:  BP Temp Temp src Pulse Resp SpO2 Height Weight  01/01/19 1200 124/72 - - (!) 112  - - - -  01/01/19 0857 124/78 98.4 F (36.9 C) Oral (!) 102 18 - - -  01/01/19 0504 130/89 98.5 F (36.9 C) Oral (!) 103 18 100 % 5\' 3"  (1.6 m) 72 kg   Gen: calm comfortable, NAD Resp: normal effort, no distress Heart: Regular rate Abd: Soft, NT  MDM Orders Placed This Encounter  Procedures  . Wet prep, genital    Standing Status:   Standing    Number of Occurrences:   1  . US OB Comp Less 14 Wks    Standing Status:   Standing    Number of Occurrences:   1    Order Specific Question:   What location should the exam be performed?    Answer:   [facility]    Order Specific Question:   Symptom/Reason for Exam    Answer:   Vaginal bleeding in pregnancy, first trimester [354656]  . CBC with Differential    Standing Status:   Standing    Number of Occurrences:   1  . hCG, quantitative, pregnancy    Standing Status:   Standing    Number of Occurrences:   1  . RPR    Standing Status:   Standing    Number of Occurrences:   1  . HIV antibody    Standing Status:   Standing    Number of Occurrences:   1  . Urinalysis, Routine w reflex microscopic    Standing Status:   Standing    Number of Occurrences:   1  .  Hepatitis B surface antigen    Standing Status:   Standing    Number of Occurrences:   1  . Hepatitis C antibody    Standing Status:   Standing    Number of Occurrences:   1  . Consult to obstetrics / gynecology    Standing Status:   Standing    Number of Occurrences:   1    Order Specific Question:   Place call to:    Answer:   faculty practice    Order Specific Question:   Reason for Consult    Answer:   Consult  . ABO/Rh    Standing Status:   Standing    Number of Occurrences:   1  . Discharge patient    Order Specific Question:   Discharge disposition    Answer:   01-Home or Self Care [1]    Order Specific Question:   Discharge patient date    Answer:   01/01/2019   Labs and [place] ordered and reviewed. Viable IUP on [place], [facility] likely explains VB, pt reassured. No  evidence of abscess or HSV, recommend warm compressions TID to vulvar lesion. Unexplained leukocytosis, no infectious source identified, will f/u in office. Stable for discharge home.   Assessment: [redacted] weeks gestation [facility] Rh pos Vulvar lesion  Plan: - Discharge home  - SAB/bleeding precautions - Follow-up at [facility] as scheduled - Return to maternity admissions if symptoms worsen  I personally was present during the history, physical exam, and medical decision-making activities of this service and have verified that the service and findings are accurately documented in the student's note.  Donette Larry, CNM 01/01/2019 12:44 PM

## 2019-01-02 LAB — HIV ANTIBODY (ROUTINE TESTING W REFLEX): HIV Screen 4th Generation wRfx: NONREACTIVE

## 2019-01-02 LAB — RPR: RPR Ser Ql: NONREACTIVE

## 2019-01-03 ENCOUNTER — Inpatient Hospital Stay (HOSPITAL_COMMUNITY): Payer: Medicaid Other

## 2019-01-03 ENCOUNTER — Inpatient Hospital Stay (EMERGENCY_DEPARTMENT_HOSPITAL)
Admission: AD | Admit: 2019-01-03 | Discharge: 2019-01-03 | Disposition: A | Discharge status: Home / Self Care | Payer: Medicaid Other | Source: Home / Self Care | Attending: Obstetrics and Gynecology | Admitting: Obstetrics and Gynecology

## 2019-01-03 ENCOUNTER — Other Ambulatory Visit: Payer: Self-pay

## 2019-01-03 ENCOUNTER — Encounter (HOSPITAL_COMMUNITY): Payer: Self-pay | Admitting: *Deleted

## 2019-01-03 DIAGNOSIS — I10 Essential (primary) hypertension: Secondary | ICD-10-CM | POA: Insufficient documentation

## 2019-01-03 DIAGNOSIS — M549 Dorsalgia, unspecified: Secondary | ICD-10-CM

## 2019-01-03 DIAGNOSIS — O039 Complete or unspecified spontaneous abortion without complication: Secondary | ICD-10-CM

## 2019-01-03 DIAGNOSIS — O9989 Other specified diseases and conditions complicating pregnancy, childbirth and the puerperium: Secondary | ICD-10-CM

## 2019-01-03 DIAGNOSIS — O10919 Unspecified pre-existing hypertension complicating pregnancy, unspecified trimester: Secondary | ICD-10-CM

## 2019-01-03 DIAGNOSIS — O26891 Other specified pregnancy related conditions, first trimester: Secondary | ICD-10-CM

## 2019-01-03 DIAGNOSIS — N898 Other specified noninflammatory disorders of vagina: Secondary | ICD-10-CM

## 2019-01-03 DIAGNOSIS — Z7982 Long term (current) use of aspirin: Secondary | ICD-10-CM

## 2019-01-03 DIAGNOSIS — O209 Hemorrhage in early pregnancy, unspecified: Secondary | ICD-10-CM

## 2019-01-03 DIAGNOSIS — Z3A11 11 weeks gestation of pregnancy: Secondary | ICD-10-CM | POA: Insufficient documentation

## 2019-01-03 DIAGNOSIS — O10911 Unspecified pre-existing hypertension complicating pregnancy, first trimester: Secondary | ICD-10-CM

## 2019-01-03 DIAGNOSIS — O99891 Other specified diseases and conditions complicating pregnancy: Secondary | ICD-10-CM

## 2019-01-03 DIAGNOSIS — R52 Pain, unspecified: Secondary | ICD-10-CM

## 2019-01-03 DIAGNOSIS — O0990 Supervision of high risk pregnancy, unspecified, unspecified trimester: Secondary | ICD-10-CM

## 2019-01-03 DIAGNOSIS — O208 Other hemorrhage in early pregnancy: Secondary | ICD-10-CM

## 2019-01-03 LAB — URINALYSIS, ROUTINE W REFLEX MICROSCOPIC
Bacteria, UA: NONE SEEN
Bilirubin Urine: NEGATIVE
Glucose, UA: 150 mg/dL — AB
Ketones, ur: NEGATIVE mg/dL
Nitrite: NEGATIVE
PH: 6 (ref 5.0–8.0)
Protein, ur: NEGATIVE mg/dL
Specific Gravity, Urine: 1.01 (ref 1.005–1.030)

## 2019-01-03 LAB — WET PREP, GENITAL
Clue Cells Wet Prep HPF POC: NONE SEEN
Sperm: NONE SEEN
Trich, Wet Prep: NONE SEEN
Yeast Wet Prep HPF POC: NONE SEEN

## 2019-01-03 LAB — INFLUENZA PANEL BY PCR (TYPE A & B)
INFLBPCR: NEGATIVE
Influenza A By PCR: NEGATIVE

## 2019-01-03 LAB — GC/CHLAMYDIA PROBE AMP (~~LOC~~) NOT AT ARMC
Chlamydia: NEGATIVE
Neisseria Gonorrhea: NEGATIVE

## 2019-01-03 MED ORDER — OXYCODONE HCL 5 MG PO TABS: 5.0000 mg | ORAL_TABLET | ORAL | 0 refills | Status: DC | PRN

## 2019-01-03 MED ORDER — CYCLOBENZAPRINE HCL 5 MG PO TABS: 5.0000 mg | ORAL_TABLET | Freq: Three times a day (TID) | ORAL | 0 refills | Status: DC | PRN

## 2019-01-03 NOTE — MAU Note (Signed)
Urine sent to lab 

## 2019-01-03 NOTE — Progress Notes (Signed)
Pt reports she passed large blood clot while up to bathroom.  Pt informed she may pass some clots secondary hx of subchorionic hemorrhage.  Pt requesting further eval of FHR secondary passing clot.  Pt informed will ask MD to come into see her regarding concerns and increased pulse.

## 2019-01-03 NOTE — Progress Notes (Signed)
Dr. Earlene Plater into see pt.  Pt will have U/S prior to discharge.

## 2019-01-03 NOTE — MAU Note (Signed)
Presents with c/o LOF several times yesterday and lower body aches especially lower back.  Reports spotting, seen other day has subchorionic hemorrhage.

## 2019-01-03 NOTE — Discharge Instructions (Signed)
I am so sorry for your loss. This miscarriage is not related to anything that you did wrong. I have prescribed a muscle relaxer and pain medicine if you need for the cramping. You should anticipate to continue having bleeding like a period. If the bleeding becomes heavier, you have an increase in your cramping, you have fevers or vomiting you need to be seen. The radiologist reviewed your imaging with me on the phone and does not believe there are any placental fragments left, therefore we did not give you any medication for that today.   Miscarriage A miscarriage is the loss of an unborn baby (fetus) before the 20th week of pregnancy. Most miscarriages happen during the first 3 months of pregnancy. Sometimes, a miscarriage can happen before a woman knows that she is pregnant. Having a miscarriage can be an emotional experience. If you have had a miscarriage, talk with your health care provider about any questions you may have about miscarrying, the grieving process, and your plans for future pregnancy. What are the causes? A miscarriage may be caused by:  Problems with the genes or chromosomes of the fetus. These problems make it impossible for the baby to develop normally. They are often the result of random errors that occur early in the development of the baby, and are not passed from parent to child (not inherited).  Infection of the cervix or uterus.  Conditions that affect hormone balance in the body.  Problems with the cervix, such as the cervix opening and thinning before pregnancy is at term (cervical insufficiency).  Problems with the uterus. These may include: ? A uterus with an abnormal shape. ? Fibroids in the uterus. ? Congenital abnormalities. These are problems that were present at birth.  Certain medical conditions.  Smoking, drinking alcohol, or using drugs.  Injury (trauma). In many cases, the cause of a miscarriage is not known. What are the signs or  symptoms? Symptoms of this condition include:  Vaginal bleeding or spotting, with or without cramps or pain.  Pain or cramping in the abdomen or lower back.  Passing fluid, tissue, or blood clots from the vagina. How is this diagnosed? This condition may be diagnosed based on:  A physical exam.  Ultrasound.  Blood tests.  Urine tests. How is this treated? Treatment for a miscarriage is sometimes not necessary if you naturally pass all the tissue that was in your uterus. If necessary, this condition may be treated with:  Dilation and curettage (D&C). This is a procedure in which the cervix is stretched open and the lining of the uterus (endometrium) is scraped. This is done only if tissue from the fetus or placenta remains in the body (incomplete miscarriage).  Medicines, such as: ? Antibiotic medicine, to treat infection. ? Medicine to help the body pass any remaining tissue. ? Medicine to reduce (contract) the size of the uterus. These medicines may be given if you have a lot of bleeding. If you have Rh negative blood and your baby was Rh positive, you will need a shot of a medicine called Rh immunoglobulinto protect your future babies from Rh blood problems. "Rh-negative" and "Rh-positive" refer to whether or not the blood has a specific protein found on the surface of red blood cells (Rh factor). Follow these instructions at home: Medicines   Take over-the-counter and prescription medicines only as told by your health care provider.  If you were prescribed antibiotic medicine, take it as told by your health care provider. Do not  stop taking the antibiotic even if you start to feel better.  Do not take NSAIDs, such as aspirin and ibuprofen, unless they are approved by your health care provider. These medicines can cause bleeding. Activity  Rest as directed. Ask your health care provider what activities are safe for you.  Have someone help with home and family  responsibilities during this time. General instructions  Keep track of the number of sanitary pads you use each day and how soaked (saturated) they are. Write down this information.  Monitor the amount of tissue or blood clots that you pass from your vagina. Save any large amounts of tissue for your health care provider to examine.  Do not use tampons, douche, or have sex until your health care provider approves.  To help you and your partner with the process of grieving, talk with your health care provider or seek counseling.  When you are ready, meet with your health care provider to discuss any important steps you should take for your health. Also, discuss steps you should take to have a healthy pregnancy in the future.  Keep all follow-up visits as told by your health care provider. This is important. Where to find more information  The American Congress of Obstetricians and Gynecologists: www.acog.org  U.S. Department of Health and CytogeneticistHuman Services Office of Womens Health: http://hoffman.com/www.womenshealth.gov Contact a health care provider if:  You have a fever or chills.  You have a foul smelling vaginal discharge.  You have more bleeding instead of less. Get help right away if:  You have severe cramps or pain in your back or abdomen.  You pass blood clots or tissue from your vagina that is walnut-sized or larger.  You soak more than 1 regular sanitary pad in an hour.  You become light-headed or weak.  You pass out.  You have feelings of sadness that take over your thoughts, or you have thoughts of hurting yourself. Summary  Most miscarriages happen in the first 3 months of pregnancy. Sometimes miscarriage happens before a woman even knows that she is pregnant.  Follow your health care provider's instruction for home care. Keep all follow-up appointments.  To help you and your partner with the process of grieving, talk with your health care provider or seek counseling. This  information is not intended to replace advice given to you by your health care provider. Make sure you discuss any questions you have with your health care provider. Document Released: 05/20/2001 Document Revised: 12/30/2016 Document Reviewed: 12/30/2016 Elsevier Interactive Patient Education  2019 [employer]

## 2019-01-03 NOTE — MAU Provider Note (Signed)
History     CSN: [id number]  Arrival date and time: 01/03/19 1034   First Provider Initiated Contact with Patient 01/03/19 1145      Chief Complaint  Patient presents with  . Body Aches  . LOF   HPI   Mary Donaldson is 33 y.o. G2P0010 female at 3974w6d who presents for concern of LOF. Reports that she had some LOF last night and was unsure if her water broke or if she urinated on herself. She has had increased vaginal discharge. No LOF since yesterday evening. Was seen in MAU two days prior for vaginal bleeding. Found to have a small subchorionic hematoma. She has continued to have some spotting. Denies passage of large clots. Also endorses back pain, generalized body aches, SOB, runny nose and subjective fevers and chills. Did receive flu vaccine this year. No know +flu contacts. Took some tylenol without significant relief of her cramps.   OB History    Gravida  2   Para      Term      Preterm      AB  1   Living        SAB      TAB  1   Ectopic      Multiple      Live Births              Past Medical History:  Diagnosis Date  . Hypertension     History reviewed. No pertinent surgical history.  Family History  Problem Relation Age of Onset  . Diabetes Mother   . Hypertension Mother     Social History   Tobacco Use  . Smoking status: Never Smoker  . Smokeless tobacco: Never Used  Substance Use Topics  . Alcohol use: Never    Frequency: Never  . Drug use: Never    Allergies: No Known Allergies  Medications Prior to Admission  Medication Sig Dispense Refill Last Dose  . aspirin EC 81 MG tablet Take 1 tablet (81 mg total) by mouth daily. Take after 12 weeks for prevention of preeclampsia later in pregnancy 300 tablet 2   . labetalol (NORMODYNE) 100 MG tablet Take 1 tablet (100 mg total) by mouth 2 (two) times daily. 60 tablet 4   . ondansetron (ZOFRAN) 4 MG tablet Take by mouth.   Taking  . Prenatal Vit-Fe Fumarate-FA (MULTIVITAMIN-PRENATAL)  27-0.8 MG TABS tablet Take 1 tablet by mouth daily at 12 noon. 30 tablet 11   . progesterone (PROMETRIUM) 200 MG capsule Take 1 capsule (200 mg total) by mouth daily. 30 capsule 0     Review of Systems  Constitutional: Positive for chills and fever.  HENT: Positive for congestion and rhinorrhea. Negative for sore throat.   Respiratory: Positive for shortness of breath. Negative for cough.   Cardiovascular: Negative for chest pain.  Gastrointestinal: Negative for abdominal pain, nausea and vomiting.  Genitourinary: Negative for dysuria, frequency and urgency.  Musculoskeletal: Positive for back pain and myalgias.  Neurological: Negative for dizziness, light-headedness and headaches.   Physical Exam   Blood pressure 137/81, pulse (!) 127, temperature 98 F (36.7 C), temperature source Oral, resp. rate 20, height 5\' 3"  (1.6 m), weight 72 kg, last menstrual period 09/30/2018, SpO2 98 %.  Physical Exam  Constitutional: She is oriented to person, place, and time. She appears well-developed and well-nourished. No distress.  HENT:  Head: Normocephalic and atraumatic.  Mouth/Throat: Oropharynx is clear and moist.  Eyes: Conjunctivae and EOM are normal.  Neck: Normal range of motion. Neck supple.  Cardiovascular: Regular rhythm and normal heart sounds.  Tachycardia.  Respiratory: Effort normal and breath sounds normal. No respiratory distress. She has no wheezes. She has no rales.  GI: Soft. She exhibits no distension. There is no abdominal tenderness. There is no rebound and no guarding.  Genitourinary:    Genitourinary Comments: Vagina without pooling. Scant amount of bleeding noted on speculum exam. No tissue present in vaginal vault. Cervix appears visually close. Digital exam reveals closed/thick/firm cervix.    Musculoskeletal: Normal range of motion.        General: No edema.     Comments: TTP diffusely over lower back. No focal CVA tenderness.   Neurological: She is alert and  oriented to person, place, and time.  Skin: Skin is warm and dry.  Psychiatric: She has a normal mood and affect.   Bedside sono performed in triage with visualization of fetus and fetal cardiac activity approximately 140 bpm. Cardiac activity visualized by Dicky Doe, RN and Nancee Liter, RN.   MAU Course  Procedures  MDM Reviewed prior ultrasound from 1/25. Small subchorionic hematoma likely accounts for small amount of vaginal bleeding on exam. Normal FHR and cervix closed so low concern for SAB. No pooling noted on exam and fern test negative. UA with trace leukocytes and large HgB, 0-5 RBCs. Large HgB likely from vaginal source. Wet prep unremarkable. GC/Chlamydia from 1/25 pending. Patient A+ so does not warrant Rhogam.   Given report of body aches, chills, fevers, rhinorrhea, SOB with recently elevated leukocytosis to 21 influenza PCR obtained.   Patient discharge orders placed. After discharge order placed, patient went to restroom and passed a large "blood clot", reportedly larger than a baseball in the toilet. HR was elevated to 140s and she endorsed abdominal cramping prior to use of restroom and afterwards. Given this new report of symptoms, ultrasound was ordered.   US Ob Comp Less 14 Wks  Result Date: 01/03/2019 CLINICAL DATA:  Vaginal bleeding in 1st trimester pregnancy. EXAM: OBSTETRIC <14 WK ULTRASOUND TECHNIQUE: Transabdominal ultrasound was performed for evaluation of the gestation as well as the maternal uterus and adnexal regions. COMPARISON:  01/01/2019 FINDINGS: Intrauterine gestational sac: None; previously seen IUP is no longer visualized Maternal uterus/adnexae: Endometrium is heterogeneous and measures 37 mm in thickness. Both ovaries are normal appearance. No mass or abnormal free fluid identified. IMPRESSION: Previous IUP no longer visualized, consistent with interval spontaneous abortion. Electronically Signed   By: Myles Rosenthal M.D.   On: 01/03/2019 16:07    Confirmed findings with Dr. Eppie Gibson. Eppie Gibson did not see any evidence of vascular tissue present that would warrant use of Cytotec.   Assessment and Plan   1. SAB (spontaneous abortion)   2. Supervision of high risk pregnancy, antepartum   3. Chronic hypertension affecting pregnancy   4. Vaginal discharge during pregnancy in first trimester   5. [redacted] weeks gestation of pregnancy   6. Body aches   7. Back pain affecting pregnancy in first trimester   8. Vaginal bleeding affecting early pregnancy     Patient with ultrasound finding consistent with complete SAB. No indication for cytotec given radiology report. Influenza swab returned negative during MAU visit. Have discharged patient with Flexeril and Oxycodone as needed for pain control. Sent message to [facility] for b-hcg in one week and 2 week follow up with a provider. Provided reassurance and support to patient and family member. Strict return precautions discussed.   De Hollingshead  01/03/2019, 11:46 AM

## 2019-01-04 ENCOUNTER — Encounter (HOSPITAL_COMMUNITY): Payer: Self-pay

## 2019-01-04 ENCOUNTER — Inpatient Hospital Stay (HOSPITAL_COMMUNITY): Payer: Medicaid Other

## 2019-01-04 ENCOUNTER — Inpatient Hospital Stay (HOSPITAL_COMMUNITY)
Admission: AD | Admit: 2019-01-04 | Discharge: 2019-01-09 | DRG: 770 | Disposition: A | Discharge status: Home / Self Care | Payer: Medicaid Other | Attending: Internal Medicine | Admitting: Internal Medicine

## 2019-01-04 ENCOUNTER — Other Ambulatory Visit: Payer: Self-pay

## 2019-01-04 ENCOUNTER — Other Ambulatory Visit: Payer: Self-pay | Admitting: Family Medicine

## 2019-01-04 DIAGNOSIS — N719 Inflammatory disease of uterus, unspecified: Secondary | ICD-10-CM

## 2019-01-04 DIAGNOSIS — N898 Other specified noninflammatory disorders of vagina: Secondary | ICD-10-CM | POA: Diagnosis not present

## 2019-01-04 DIAGNOSIS — O0337 Sepsis following incomplete spontaneous abortion: Principal | ICD-10-CM | POA: Diagnosis present

## 2019-01-04 DIAGNOSIS — O030 Genital tract and pelvic infection following incomplete spontaneous abortion: Secondary | ICD-10-CM

## 2019-01-04 DIAGNOSIS — O0882 Sepsis following ectopic and molar pregnancy: Secondary | ICD-10-CM | POA: Diagnosis not present

## 2019-01-04 DIAGNOSIS — O035 Genital tract and pelvic infection following complete or unspecified spontaneous abortion: Secondary | ICD-10-CM | POA: Diagnosis present

## 2019-01-04 DIAGNOSIS — E876 Hypokalemia: Secondary | ICD-10-CM | POA: Diagnosis present

## 2019-01-04 DIAGNOSIS — D62 Acute posthemorrhagic anemia: Secondary | ICD-10-CM | POA: Diagnosis present

## 2019-01-04 DIAGNOSIS — O039 Complete or unspecified spontaneous abortion without complication: Secondary | ICD-10-CM | POA: Diagnosis not present

## 2019-01-04 DIAGNOSIS — S27309A Unspecified injury of lung, unspecified, initial encounter: Secondary | ICD-10-CM | POA: Diagnosis present

## 2019-01-04 DIAGNOSIS — J9690 Respiratory failure, unspecified, unspecified whether with hypoxia or hypercapnia: Secondary | ICD-10-CM

## 2019-01-04 DIAGNOSIS — N809 Endometriosis, unspecified: Secondary | ICD-10-CM | POA: Diagnosis present

## 2019-01-04 DIAGNOSIS — O0383 Metabolic disorder following complete or unspecified spontaneous abortion: Secondary | ICD-10-CM | POA: Diagnosis present

## 2019-01-04 DIAGNOSIS — A419 Sepsis, unspecified organism: Secondary | ICD-10-CM | POA: Diagnosis present

## 2019-01-04 DIAGNOSIS — R52 Pain, unspecified: Secondary | ICD-10-CM | POA: Diagnosis not present

## 2019-01-04 DIAGNOSIS — O0387 Sepsis following complete or unspecified spontaneous abortion: Secondary | ICD-10-CM

## 2019-01-04 DIAGNOSIS — E871 Hypo-osmolality and hyponatremia: Secondary | ICD-10-CM | POA: Diagnosis present

## 2019-01-04 DIAGNOSIS — J9601 Acute respiratory failure with hypoxia: Secondary | ICD-10-CM | POA: Diagnosis not present

## 2019-01-04 DIAGNOSIS — R000 Tachycardia, unspecified: Secondary | ICD-10-CM | POA: Diagnosis present

## 2019-01-04 DIAGNOSIS — O26891 Other specified pregnancy related conditions, first trimester: Secondary | ICD-10-CM | POA: Diagnosis not present

## 2019-01-04 DIAGNOSIS — J069 Acute upper respiratory infection, unspecified: Secondary | ICD-10-CM | POA: Diagnosis present

## 2019-01-04 DIAGNOSIS — O021 Missed abortion: Secondary | ICD-10-CM | POA: Diagnosis not present

## 2019-01-04 DIAGNOSIS — J80 Acute respiratory distress syndrome: Secondary | ICD-10-CM | POA: Diagnosis not present

## 2019-01-04 LAB — CBC WITH DIFFERENTIAL/PLATELET
Basophils Absolute: 0 10*3/uL (ref 0.0–0.1)
Basophils Relative: 0 %
EOS ABS: 0 10*3/uL (ref 0.0–0.5)
Eosinophils Relative: 0 %
HCT: 30.4 % — ABNORMAL LOW (ref 36.0–46.0)
Hemoglobin: 9.7 g/dL — ABNORMAL LOW (ref 12.0–15.0)
Lymphocytes Relative: 10 %
Lymphs Abs: 1.6 10*3/uL (ref 0.7–4.0)
MCH: 18.9 pg — ABNORMAL LOW (ref 26.0–34.0)
MCHC: 31.9 g/dL (ref 30.0–36.0)
MCV: 59.3 fL — ABNORMAL LOW (ref 80.0–100.0)
Monocytes Absolute: 0.4 10*3/uL (ref 0.1–1.0)
Monocytes Relative: 2 %
NRBC: 0.1 % (ref 0.0–0.2)
Neutro Abs: 14.2 10*3/uL — ABNORMAL HIGH (ref 1.7–7.7)
Neutrophils Relative %: 88 %
Platelets: 295 10*3/uL (ref 150–400)
RBC: 5.13 MIL/uL — ABNORMAL HIGH (ref 3.87–5.11)
RDW: 18.3 % — ABNORMAL HIGH (ref 11.5–15.5)
WBC: 16.2 10*3/uL — ABNORMAL HIGH (ref 4.0–10.5)

## 2019-01-04 LAB — URINALYSIS, ROUTINE W REFLEX MICROSCOPIC
Bacteria, UA: NONE SEEN
Bilirubin Urine: NEGATIVE
Glucose, UA: 150 mg/dL — AB
Ketones, ur: NEGATIVE mg/dL
Nitrite: NEGATIVE
Protein, ur: 30 mg/dL — AB
Specific Gravity, Urine: 1.024 (ref 1.005–1.030)
pH: 6 (ref 5.0–8.0)

## 2019-01-04 LAB — COMPREHENSIVE METABOLIC PANEL
ALK PHOS: 85 U/L (ref 38–126)
ALT: 28 U/L (ref 0–44)
AST: 32 U/L (ref 15–41)
Albumin: 3.1 g/dL — ABNORMAL LOW (ref 3.5–5.0)
Anion gap: 8 (ref 5–15)
BUN: 6 mg/dL (ref 6–20)
CO2: 18 mmol/L — ABNORMAL LOW (ref 22–32)
CREATININE: 0.54 mg/dL (ref 0.44–1.00)
Calcium: 8.2 mg/dL — ABNORMAL LOW (ref 8.9–10.3)
Chloride: 102 mmol/L (ref 98–111)
GFR calc Af Amer: >60 mL/min (ref >60)
GFR calc non Af Amer: >60 mL/min (ref >60)
Glucose, Bld: 120 mg/dL — ABNORMAL HIGH (ref 70–99)
Potassium: 3.4 mmol/L — ABNORMAL LOW (ref 3.5–5.1)
SODIUM: 128 mmol/L — AB (ref 135–145)
Total Bilirubin: 0.3 mg/dL (ref 0.3–1.2)
Total Protein: 6.5 g/dL (ref 6.5–8.1)

## 2019-01-04 LAB — LACTIC ACID, PLASMA
Lactic Acid, Venous: 1.2 mmol/L (ref 0.5–1.9)
Lactic Acid, Venous: 0.6 mmol/L (ref 0.5–1.9)

## 2019-01-04 LAB — HCG, QUANTITATIVE, PREGNANCY: hCG, Beta Chain, Quant, S: 49244 m[IU]/mL — ABNORMAL HIGH (ref <5)

## 2019-01-04 MED ORDER — LABETALOL HCL 100 MG PO TABS
100.0000 mg | ORAL_TABLET | Freq: Two times a day (BID) | ORAL | Status: DC
  Administered 2019-01-04 – 2019-01-07 (×5): 100 mg via ORAL
  Filled 2019-01-04 (×5): qty 1

## 2019-01-04 MED ORDER — SODIUM CHLORIDE 0.9 % IV BOLUS (SEPSIS)
1000.0000 mL | Freq: Once | INTRAVENOUS | Status: AC
  Administered 2019-01-04: 1000 mL via INTRAVENOUS

## 2019-01-04 MED ORDER — CLINDAMYCIN PHOSPHATE 900 MG/50ML IV SOLN: 900.0000 mg | Freq: Three times a day (TID) | INTRAVENOUS | Status: DC

## 2019-01-04 MED ORDER — OXYCODONE-ACETAMINOPHEN 5-325 MG PO TABS
2.0000 | ORAL_TABLET | Freq: Once | ORAL | Status: AC
  Administered 2019-01-04: 2 via ORAL
  Filled 2019-01-04: qty 2

## 2019-01-04 MED ORDER — SODIUM CHLORIDE 0.9 % IV BOLUS (SEPSIS): 250.0000 mL | Freq: Once | INTRAVENOUS | Status: DC

## 2019-01-04 MED ORDER — SODIUM CHLORIDE 0.9 % IV SOLN
1000.0000 mL | INTRAVENOUS | Status: DC
  Administered 2019-01-04 – 2019-01-07 (×8): 1000 mL via INTRAVENOUS

## 2019-01-04 MED ORDER — ONDANSETRON HCL 4 MG/2ML IJ SOLN: 4.0000 mg | Freq: Four times a day (QID) | INTRAMUSCULAR | Status: DC | PRN

## 2019-01-04 MED ORDER — PRENATAL MULTIVITAMIN CH
1.0000 | ORAL_TABLET | Freq: Every day | ORAL | Status: DC
  Administered 2019-01-06 – 2019-01-08 (×2): 1 via ORAL
  Filled 2019-01-04 (×3): qty 1

## 2019-01-04 MED ORDER — ONDANSETRON HCL 4 MG PO TABS: 4.0000 mg | ORAL_TABLET | Freq: Four times a day (QID) | ORAL | Status: DC | PRN

## 2019-01-04 MED ORDER — GENTAMICIN SULFATE 40 MG/ML IJ SOLN
120.0000 mg | Freq: Three times a day (TID) | INTRAVENOUS | Status: DC
  Administered 2019-01-04 – 2019-01-07 (×8): 120 mg via INTRAVENOUS
  Filled 2019-01-04 (×9): qty 3

## 2019-01-04 NOTE — MAU Provider Note (Addendum)
Chief Complaint: Back Pain and Fever   First Provider Initiated Contact with Patient 01/04/19 1701     SUBJECTIVE HPI: Mary Donaldson is a 32 y.o. G2P0010 at 1 day S/P 12 week SAB yesterday, 1/27 in MAU who presents to [facility] Admissions reporting fever 100.3, chills, body aches, fatigue, left lower quadrant pain, headache, back pain, malaise.    Neg flu swab yesterday. Pt and family also very concerned about Sx being due to Malaria. Pt's family member is doctor and has her concerned that because she was in [place] as recently at November 2019 that she could have it. Pt also reports a family member had a delayed Dx of Malaria. Pt denies Hx of Malaria or malaria Sx in past.    Quality: aches Severity: 9/10 on pain scale headache, 7/10 abd and back pain Duration: 12 hours Context: S/P SAB.  Timing: conctant Modifying factors: minimal improvement w/ Ibuprofen at 1400 today.  Associated signs and symptoms: Neg for GI complaints, urinary complaints, neck pain or stiffness, respiratory complaints. Scant VB.   Past Medical History:  Diagnosis Date  . Hypertension    OB History  Gravida Para Term Preterm AB Living  2       1    SAB TAB Ectopic Multiple Live Births    1          # Outcome Date GA Lbr Len/2nd Weight Sex Delivery Anes PTL Lv  2 Gravida           1 TAB            History reviewed. No pertinent surgical history. Social History   Socioeconomic History  . Marital status: Married    Spouse name: Not on file  . Number of children: Not on file  . Years of education: Not on file  . Highest education level: Not on file  Occupational History  . Occupation: tax professional    Comment: [employer]  Social Needs  . Financial resource strain: Not on file  . Food insecurity:    Worry: Not on file    Inability: Not on file  . Transportation needs:    Medical: Not on file    Non-medical: Not on file  Tobacco Use  . Smoking status: Never Smoker  . Smokeless tobacco: Never Used   Substance and Sexual Activity  . Alcohol use: Never    Frequency: Never  . Drug use: Never  . Sexual activity: Yes  Lifestyle  . Physical activity:    Days per week: Not on file    Minutes per session: Not on file  . Stress: Not on file  Relationships  . Social connections:    Talks on phone: Not on file    Gets together: Not on file    Attends religious service: Not on file    Active member of club or organization: Not on file    Attends meetings of clubs or organizations: Not on file    Relationship status: Not on file  . Intimate partner violence:    Fear of current or ex partner: Not on file    Emotionally abused: Not on file    Physically abused: Not on file    Forced sexual activity: Not on file  Other Topics Concern  . Not on file  Social History Narrative  . Not on file   Family History  Problem Relation Age of Onset  . Diabetes Mother   . Hypertension Mother    No current   facility-administered medications on file prior to encounter.    Current Outpatient Medications on File Prior to Encounter  Medication Sig Dispense Refill  . aspirin EC 81 MG tablet Take 1 tablet (81 mg total) by mouth daily. Take after 12 weeks for prevention of preeclampsia later in pregnancy 300 tablet 2  . cyclobenzaprine (FLEXERIL) 5 MG tablet Take 1 tablet (5 mg total) by mouth 3 (three) times daily as needed for muscle spasms. 30 tablet 0  . labetalol (NORMODYNE) 100 MG tablet Take 1 tablet (100 mg total) by mouth 2 (two) times daily. 60 tablet 4  . ondansetron (ZOFRAN) 4 MG tablet Take by mouth.    . oxyCODONE (OXY IR/ROXICODONE) 5 MG immediate release tablet Take 1 tablet (5 mg total) by mouth every 4 (four) hours as needed for severe pain. 10 tablet 0  . Prenatal Vit-Fe Fumarate-FA (MULTIVITAMIN-PRENATAL) 27-0.8 MG TABS tablet Take 1 tablet by mouth daily at 12 noon. 30 tablet 11  . progesterone (PROMETRIUM) 200 MG capsule Take 1 capsule (200 mg total) by mouth daily. 30 capsule 0    No Known Allergies  I have reviewed patient's Past Medical Hx, Surgical Hx, Family Hx, Social Hx, medications and allergies.   Review of Systems  Constitutional: Positive for chills, fatigue and fever. Negative for appetite change.  HENT: Negative for congestion, ear pain, rhinorrhea, sinus pressure and sore throat.   Eyes: Negative for photophobia and visual disturbance.  Respiratory: Negative for cough.   Gastrointestinal: Positive for abdominal pain. Negative for diarrhea, nausea and vomiting.  Genitourinary: Positive for vaginal bleeding. Negative for dysuria, flank pain, frequency, hematuria, pelvic pain and vaginal discharge.  Musculoskeletal: Positive for back pain and myalgias. Negative for neck pain and neck stiffness.  Skin: Negative for wound.  Neurological: Positive for dizziness and headaches. Negative for speech difficulty.  Psychiatric/Behavioral: Negative for confusion.    OBJECTIVE Patient Vitals for the past 24 hrs:  BP Temp Temp src Pulse Resp SpO2 Height Weight  01/04/19 1946 - 98 F (36.7 C) Oral - - - - -  01/04/19 1910 102/60 (!) 97.3 F (36.3 C) Oral (!) 104 18 - - -  01/04/19 1909 - - - - - 98 % - -  01/04/19 1904 - - - - - 100 % - -  01/04/19 1859 - - - - - 98 % - -  01/04/19 1857 - - - (!) 125 - 100 % - -  01/04/19 1804 - - - - - 100 % - -  01/04/19 1759 - - - - - 100 % - -  01/04/19 1754 - - - - - 100 % - -  01/04/19 1749 - - - - - 100 % - -  01/04/19 1729 - - - - - 100 % - -  01/04/19 1724 - - - - - 99 % - -  01/04/19 1719 - - - - - 99 % - -  01/04/19 1714 - - - - - 99 % - -  01/04/19 1709 - - - - - 100 % - -  01/04/19 1704 - - - - - 100 % - -  01/04/19 1659 - - - - - 99 % - -  01/04/19 1654 - - - - - 99 % - -  01/04/19 1649 - - - - - 99 % - -  01/04/19 1644 - - - - - 100 % - -  01/04/19 1639 - - - - - 99 % - -    01/04/19 1634 - - - - - 99 % - -  01/04/19 1632 126/70 (!) 100.4 F (38 C) Oral (!) 132 20 - - -  01/04/19 1619 - - - - - - 5'  3" (1.6 m) 72.8 kg   Constitutional: Well-developed, well-nourished female in no acute distress. Mildly ill-appearing.  Cardiovascular: tachycardic Respiratory: normal rate and effort.  GI: Abd soft, Mild low abd tenderness, L>R Neurologic: Alert and oriented x 4.  GU: Neg CVAT.  SPECULUM EXAM: Deferred  LAB RESULTS Results for orders placed or performed during the hospital encounter of 01/04/19 (from the past 24 hour(s))  Urinalysis, Routine w reflex microscopic     Status: Abnormal   Collection Time: 01/04/19  4:48 PM  Result Value Ref Range   Color, Urine YELLOW YELLOW   APPearance HAZY (A) CLEAR   Specific Gravity, Urine 1.024 1.005 - 1.030   pH 6.0 5.0 - 8.0   Glucose, UA 150 (A) NEGATIVE mg/dL   Hgb urine dipstick LARGE (A) NEGATIVE   Bilirubin Urine NEGATIVE NEGATIVE   Ketones, ur NEGATIVE NEGATIVE mg/dL   Protein, ur 30 (A) NEGATIVE mg/dL   Nitrite NEGATIVE NEGATIVE   Leukocytes, UA LARGE (A) NEGATIVE   RBC / HPF 11-20 0 - 5 RBC/hpf   WBC, UA >50 (H) 0 - 5 WBC/hpf   Bacteria, UA NONE SEEN NONE SEEN   Squamous Epithelial / LPF 6-10 0 - 5   Mucus PRESENT   CBC with Differential/Platelet     Status: Abnormal   Collection Time: 01/04/19  4:54 PM  Result Value Ref Range   WBC 16.2 (H) 4.0 - 10.5 K/uL   RBC 5.13 (H) 3.87 - 5.11 MIL/uL   Hemoglobin 9.7 (L) 12.0 - 15.0 g/dL   HCT 30.4 (L) 36.0 - 46.0 %   MCV 59.3 (L) 80.0 - 100.0 fL   MCH 18.9 (L) 26.0 - 34.0 pg   MCHC 31.9 30.0 - 36.0 g/dL   RDW 18.3 (H) 11.5 - 15.5 %   Platelets 295 150 - 400 K/uL   nRBC 0.1 0.0 - 0.2 %   Neutrophils Relative % 88 %   Neutro Abs 14.2 (H) 1.7 - 7.7 K/uL   Lymphocytes Relative 10 %   Lymphs Abs 1.6 0.7 - 4.0 K/uL   Monocytes Relative 2 %   Monocytes Absolute 0.4 0.1 - 1.0 K/uL   Eosinophils Relative 0 %   Eosinophils Absolute 0.0 0.0 - 0.5 K/uL   Basophils Relative 0 %   Basophils Absolute 0.0 0.0 - 0.1 K/uL  Comprehensive metabolic panel     Status: Abnormal   Collection Time:  01/04/19  4:54 PM  Result Value Ref Range   Sodium 128 (L) 135 - 145 mmol/L   Potassium 3.4 (L) 3.5 - 5.1 mmol/L   Chloride 102 98 - 111 mmol/L   CO2 18 (L) 22 - 32 mmol/L   Glucose, Bld 120 (H) 70 - 99 mg/dL   BUN 6 6 - 20 mg/dL   Creatinine, Ser 0.54 0.44 - 1.00 mg/dL   Calcium 8.2 (L) 8.9 - 10.3 mg/dL   Total Protein 6.5 6.5 - 8.1 g/dL   Albumin 3.1 (L) 3.5 - 5.0 g/dL   AST 32 15 - 41 U/L   ALT 28 0 - 44 U/L   Alkaline Phosphatase 85 38 - 126 U/L   Total Bilirubin 0.3 0.3 - 1.2 mg/dL   GFR calc non Af Amer >60 >60 mL/min   GFR calc Af   Amer >60 >60 mL/min   Anion gap 8 5 - 15  Lactic acid, plasma     Status: None   Collection Time: 01/04/19  5:50 PM  Result Value Ref Range   Lactic Acid, Venous 1.2 0.5 - 1.9 mmol/L  hCG, quantitative, pregnancy     Status: Abnormal   Collection Time: 01/04/19  5:52 PM  Result Value Ref Range   hCG, Beta Chain, Quant, S 49,244 (H) <5 mIU/mL    IMAGING Us Ob Comp Less 14 Wks  Result Date: 01/04/2019 CLINICAL DATA:  Follow-up spontaneous miscarriage with sepsis, continued bleeding. EXAM: OBSTETRIC <14 WK ULTRASOUND TECHNIQUE: Transabdominal ultrasound was performed for evaluation of the gestation as well as the maternal uterus and adnexal regions. COMPARISON:  Obstetrical ultrasound January 03, 2019. FINDINGS: Intrauterine gestational sac: Not present. Yolk sac:  Not present. Embryo:  Not present. Cardiac Activity: Not applicable. Subchorionic hemorrhage:  None visualized. Maternal uterus/adnexae: 3.6 cm thickened heterogeneously echogenic endometrium similar to prior ultrasound with increased vascularity. Normal appearance of the adnexa. No free fluid. IMPRESSION: 1. No sonographically identified IUP. 2. Similar thickened debris within endometrium seen with blood products or retained products of conception. Recommend serial HCG and follow-up ultrasound as clinically indicated. Electronically Signed   By: Courtnay  Bloomer M.D.   On: 01/04/2019 18:58       MAU COURSE  Orders Placed This Encounter  Procedures  . Blood Culture (routine x 2)  . Urine Culture  . US OB Comp Less 14 Wks  . Urinalysis, Routine w reflex microscopic  . CBC with Differential/Platelet  . Lactic acid, plasma  . Comprehensive metabolic panel  . hCG, quantitative, pregnancy  . Lactic acid, plasma  . Plasmodium Sp. PCR  . Refer to Sidebar Report for: Sepsis Bundle ED/IP  . Document vital signs within 1-hour of fluid bolus completion and notify provider of bolus completion  . Document Actual / Estimated Weight  . Initiate Carrier Fluid Protocol  . Orthostatic vital signs  . Call Code Sepsis ([facility] [phone]) Reason for Consult? tracking  . pharmacy consult  . gentamicin per pharmacy consult  . Pulse oximetry, continuous  . Admit to Inpatient (patient's expected length of stay will be greater than 2 midnights or inpatient only procedure)     Meds ordered this encounter  Medications  . 0.9 %  sodium chloride infusion  . AND Linked Order Group   . sodium chloride 0.9 % bolus 1,000 mL     Order Specific Question:   Total Body Weight basis for 30 mL/kg  bolus delivery     Answer:   72.8 kg   . sodium chloride 0.9 % bolus 1,000 mL     Order Specific Question:   Total Body Weight basis for 30 mL/kg  bolus delivery     Answer:   72.8 kg   . sodium chloride 0.9 % bolus 250 mL     Order Specific Question:   Total Body Weight basis for 30 mL/kg  bolus delivery     Answer:   72.8 kg  . DISCONTD: clindamycin (CLEOCIN) IVPB 900 mg    Order Specific Question:   Antibiotic Indication:    Answer:   Other Indication (list below)    Order Specific Question:   Other Indication:    Answer:   Endometritis  . oxyCODONE-acetaminophen (PERCOCET/ROXICET) 5-325 MG per tablet 2 tablet  . gentamicin (GARAMYCIN) 120 mg, clindamycin (CLEOCIN) 900 mg in dextrose 5 % 100 mL IVPB      Order Specific Question:   Indication:    Answer:   Endometritis   Discussed Hx, labs,  exam w/ Dr. Stinson. Agrees w/ POC. New orders: US, Quant, Gent/Clinda.   1855: back from US. Now complaining of intermittent dizziness. Orthostatic VS and labs stable. Likely from Percocet.   Discussed US, labs, w/ Dr. Stinson. Pt needs D/C. Since she is afebrile, Nml lactic acid and  Tachycardia improved will do in am. Discussed R/B/I D&C vs expectant management in pt w/ endometritis. Questions addressed. Pt agrees w/ POC. Will transfer to 3rd floor.   MDM - Endometritis 2/2 retained POCs. Stable for D&C in am. Continue Gent, Clinda, IV fluids, antipyretics, pain meds, tonight.    ASSESSMENT 1. Spontaneous abortion with infection  2. Endometritis   3. Tachycardia     PLAN Admit to third floor fer consult w/ Dr/ Stinson. Gent, Clinda Tylenol IV fluids  Toney Lizaola, CNM 01/04/2019  8:45 PM   

## 2019-01-04 NOTE — H&P (Signed)
Chief Complaint: Back Pain and Fever   First Provider Initiated Contact with Patient 01/04/19 1701     SUBJECTIVE HPI: Mary Donaldson is a 33 y.o. G2P0010 at 1 day S/P 12 week SAB yesterday, 1/27 in MAU who presents to [facility] Admissions reporting fever 100.3, chills, body aches, fatigue, left lower quadrant pain, headache, back pain, malaise.    Neg flu swab yesterday. Pt and family also very concerned about Sx being due to Malaria. Pt's family member is doctor and has her concerned that because she was in [place] as recently at November 2019 that she could have it. Pt also reports a family member had a delayed Dx of Malaria. Pt denies Hx of Malaria or malaria Sx in past.    Quality: aches Severity: 9/10 on pain scale headache, 7/10 abd and back pain Duration: 12 hours Context: S/P SAB.  Timing: conctant Modifying factors: minimal improvement w/ Ibuprofen at 1400 today.  Associated signs and symptoms: Neg for GI complaints, urinary complaints, neck pain or stiffness, respiratory complaints. Scant VB.   Past Medical History:  Diagnosis Date  . Hypertension    OB History  Gravida Para Term Preterm AB Living  2       1    SAB TAB Ectopic Multiple Live Births    1          # Outcome Date GA Lbr Len/2nd Weight Sex Delivery Anes PTL Lv  2 Gravida           1 TAB            History reviewed. No pertinent surgical history. Social History   Socioeconomic History  . Marital status: Married    Spouse name: Not on file  . Number of children: Not on file  . Years of education: Not on file  . Highest education level: Not on file  Occupational History  . Occupation: tax professional    Comment: [employer]  Social Needs  . Financial resource strain: Not on file  . Food insecurity:    Worry: Not on file    Inability: Not on file  . Transportation needs:    Medical: Not on file    Non-medical: Not on file  Tobacco Use  . Smoking status: Never Smoker  . Smokeless tobacco: Never Used   Substance and Sexual Activity  . Alcohol use: Never    Frequency: Never  . Drug use: Never  . Sexual activity: Yes  Lifestyle  . Physical activity:    Days per week: Not on file    Minutes per session: Not on file  . Stress: Not on file  Relationships  . Social connections:    Talks on phone: Not on file    Gets together: Not on file    Attends religious service: Not on file    Active member of club or organization: Not on file    Attends meetings of clubs or organizations: Not on file    Relationship status: Not on file  . Intimate partner violence:    Fear of current or ex partner: Not on file    Emotionally abused: Not on file    Physically abused: Not on file    Forced sexual activity: Not on file  Other Topics Concern  . Not on file  Social History Narrative  . Not on file   Family History  Problem Relation Age of Onset  . Diabetes Mother   . Hypertension Mother    No current  facility-administered medications on file prior to encounter.    Current Outpatient Medications on File Prior to Encounter  Medication Sig Dispense Refill  . aspirin EC 81 MG tablet Take 1 tablet (81 mg total) by mouth daily. Take after 12 weeks for prevention of preeclampsia later in pregnancy 300 tablet 2  . cyclobenzaprine (FLEXERIL) 5 MG tablet Take 1 tablet (5 mg total) by mouth 3 (three) times daily as needed for muscle spasms. 30 tablet 0  . labetalol (NORMODYNE) 100 MG tablet Take 1 tablet (100 mg total) by mouth 2 (two) times daily. 60 tablet 4  . ondansetron (ZOFRAN) 4 MG tablet Take by mouth.    . oxyCODONE (OXY IR/ROXICODONE) 5 MG immediate release tablet Take 1 tablet (5 mg total) by mouth every 4 (four) hours as needed for severe pain. 10 tablet 0  . Prenatal Vit-Fe Fumarate-FA (MULTIVITAMIN-PRENATAL) 27-0.8 MG TABS tablet Take 1 tablet by mouth daily at 12 noon. 30 tablet 11  . progesterone (PROMETRIUM) 200 MG capsule Take 1 capsule (200 mg total) by mouth daily. 30 capsule 0    No Known Allergies  I have reviewed patient's Past Medical Hx, Surgical Hx, Family Hx, Social Hx, medications and allergies.   Review of Systems  Constitutional: Positive for chills, fatigue and fever. Negative for appetite change.  HENT: Negative for congestion, ear pain, rhinorrhea, sinus pressure and sore throat.   Eyes: Negative for photophobia and visual disturbance.  Respiratory: Negative for cough.   Gastrointestinal: Positive for abdominal pain. Negative for diarrhea, nausea and vomiting.  Genitourinary: Positive for vaginal bleeding. Negative for dysuria, flank pain, frequency, hematuria, pelvic pain and vaginal discharge.  Musculoskeletal: Positive for back pain and myalgias. Negative for neck pain and neck stiffness.  Skin: Negative for wound.  Neurological: Positive for dizziness and headaches. Negative for speech difficulty.  Psychiatric/Behavioral: Negative for confusion.    OBJECTIVE Patient Vitals for the past 24 hrs:  BP Temp Temp src Pulse Resp SpO2 Height Weight  01/04/19 1946 - 98 F (36.7 C) Oral - - - - -  01/04/19 1910 102/60 (!) 97.3 F (36.3 C) Oral (!) 104 18 - - -  01/04/19 1909 - - - - - 98 % - -  01/04/19 1904 - - - - - 100 % - -  01/04/19 1859 - - - - - 98 % - -  01/04/19 1857 - - - (!) 125 - 100 % - -  01/04/19 1804 - - - - - 100 % - -  01/04/19 1759 - - - - - 100 % - -  01/04/19 1754 - - - - - 100 % - -  01/04/19 1749 - - - - - 100 % - -  01/04/19 1729 - - - - - 100 % - -  01/04/19 1724 - - - - - 99 % - -  01/04/19 1719 - - - - - 99 % - -  01/04/19 1714 - - - - - 99 % - -  01/04/19 1709 - - - - - 100 % - -  01/04/19 1704 - - - - - 100 % - -  01/04/19 1659 - - - - - 99 % - -  01/04/19 1654 - - - - - 99 % - -  01/04/19 1649 - - - - - 99 % - -  01/04/19 1644 - - - - - 100 % - -  01/04/19 1639 - - - - - 99 % - -  01/04/19 1634 - - - - - 99 % - -  01/04/19 1632 126/70 (!) 100.4 F (38 C) Oral (!) 132 20 - - -  01/04/19 1619 - - - - - - 5'  3" (1.6 m) 72.8 kg   Constitutional: Well-developed, well-nourished female in no acute distress. Mildly ill-appearing.  Cardiovascular: tachycardic Respiratory: normal rate and effort.  GI: Abd soft, Mild low abd tenderness, L>R Neurologic: Alert and oriented x 4.  GU: Neg CVAT.  SPECULUM EXAM: Deferred  LAB RESULTS Results for orders placed or performed during the hospital encounter of 01/04/19 (from the past 24 hour(s))  Urinalysis, Routine w reflex microscopic     Status: Abnormal   Collection Time: 01/04/19  4:48 PM  Result Value Ref Range   Color, Urine YELLOW YELLOW   APPearance HAZY (A) CLEAR   Specific Gravity, Urine 1.024 1.005 - 1.030   pH 6.0 5.0 - 8.0   Glucose, UA 150 (A) NEGATIVE mg/dL   Hgb urine dipstick LARGE (A) NEGATIVE   Bilirubin Urine NEGATIVE NEGATIVE   Ketones, ur NEGATIVE NEGATIVE mg/dL   Protein, ur 30 (A) NEGATIVE mg/dL   Nitrite NEGATIVE NEGATIVE   Leukocytes, UA LARGE (A) NEGATIVE   RBC / HPF 11-20 0 - 5 RBC/hpf   WBC, UA >50 (H) 0 - 5 WBC/hpf   Bacteria, UA NONE SEEN NONE SEEN   Squamous Epithelial / LPF 6-10 0 - 5   Mucus PRESENT   CBC with Differential/Platelet     Status: Abnormal   Collection Time: 01/04/19  4:54 PM  Result Value Ref Range   WBC 16.2 (H) 4.0 - 10.5 K/uL   RBC 5.13 (H) 3.87 - 5.11 MIL/uL   Hemoglobin 9.7 (L) 12.0 - 15.0 g/dL   HCT 83.2 (L) 54.9 - 82.6 %   MCV 59.3 (L) 80.0 - 100.0 fL   MCH 18.9 (L) 26.0 - 34.0 pg   MCHC 31.9 30.0 - 36.0 g/dL   RDW 41.5 (H) 83.0 - 94.0 %   Platelets 295 150 - 400 K/uL   nRBC 0.1 0.0 - 0.2 %   Neutrophils Relative % 88 %   Neutro Abs 14.2 (H) 1.7 - 7.7 K/uL   Lymphocytes Relative 10 %   Lymphs Abs 1.6 0.7 - 4.0 K/uL   Monocytes Relative 2 %   Monocytes Absolute 0.4 0.1 - 1.0 K/uL   Eosinophils Relative 0 %   Eosinophils Absolute 0.0 0.0 - 0.5 K/uL   Basophils Relative 0 %   Basophils Absolute 0.0 0.0 - 0.1 K/uL  Comprehensive metabolic panel     Status: Abnormal   Collection Time:  01/04/19  4:54 PM  Result Value Ref Range   Sodium 128 (L) 135 - 145 mmol/L   Potassium 3.4 (L) 3.5 - 5.1 mmol/L   Chloride 102 98 - 111 mmol/L   CO2 18 (L) 22 - 32 mmol/L   Glucose, Bld 120 (H) 70 - 99 mg/dL   BUN 6 6 - 20 mg/dL   Creatinine, Ser 7.68 0.44 - 1.00 mg/dL   Calcium 8.2 (L) 8.9 - 10.3 mg/dL   Total Protein 6.5 6.5 - 8.1 g/dL   Albumin 3.1 (L) 3.5 - 5.0 g/dL   AST 32 15 - 41 U/L   ALT 28 0 - 44 U/L   Alkaline Phosphatase 85 38 - 126 U/L   Total Bilirubin 0.3 0.3 - 1.2 mg/dL   GFR calc non Af Amer >60 >60 mL/min   GFR calc Af  Amer >60 >60 mL/min   Anion gap 8 5 - 15  Lactic acid, plasma     Status: None   Collection Time: 01/04/19  5:50 PM  Result Value Ref Range   Lactic Acid, Venous 1.2 0.5 - 1.9 mmol/L  hCG, quantitative, pregnancy     Status: Abnormal   Collection Time: 01/04/19  5:52 PM  Result Value Ref Range   hCG, Beta Chain, Quant, S 49,244 (H) <5 mIU/mL    IMAGING US Ob Comp Less 14 Wks  Result Date: 01/04/2019 CLINICAL DATA:  Follow-up spontaneous miscarriage with sepsis, continued bleeding. EXAM: OBSTETRIC <14 WK ULTRASOUND TECHNIQUE: Transabdominal ultrasound was performed for evaluation of the gestation as well as the maternal uterus and adnexal regions. COMPARISON:  Obstetrical ultrasound January 03, 2019. FINDINGS: Intrauterine gestational sac: Not present. Yolk sac:  Not present. Embryo:  Not present. Cardiac Activity: Not applicable. Subchorionic hemorrhage:  None visualized. Maternal uterus/adnexae: 3.6 cm thickened heterogeneously echogenic endometrium similar to prior ultrasound with increased vascularity. Normal appearance of the adnexa. No free fluid. IMPRESSION: 1. No sonographically identified IUP. 2. Similar thickened debris within endometrium seen with blood products or retained products of conception. Recommend serial HCG and follow-up ultrasound as clinically indicated. Electronically Signed   By: Awilda Metro M.D.   On: 01/04/2019 18:58       MAU COURSE  Orders Placed This Encounter  Procedures  . Blood Culture (routine x 2)  . Urine Culture  . US OB Comp Less 14 Wks  . Urinalysis, Routine w reflex microscopic  . CBC with Differential/Platelet  . Lactic acid, plasma  . Comprehensive metabolic panel  . hCG, quantitative, pregnancy  . Lactic acid, plasma  . Plasmodium Sp. PCR  . Refer to Sidebar Report for: Sepsis Bundle ED/IP  . Document vital signs within 1-hour of fluid bolus completion and notify provider of bolus completion  . Document Actual / Estimated Weight  . Initiate Carrier Fluid Protocol  . Orthostatic vital signs  . Call Code Sepsis ([facility] [phone]) Reason for Consult? tracking  . pharmacy consult  . gentamicin per pharmacy consult  . Pulse oximetry, continuous  . Admit to Inpatient (patient's expected length of stay will be greater than 2 midnights or inpatient only procedure)     Meds ordered this encounter  Medications  . 0.9 %  sodium chloride infusion  . AND Linked Order Group   . sodium chloride 0.9 % bolus 1,000 mL     Order Specific Question:   Total Body Weight basis for 30 mL/kg  bolus delivery     Answer:   72.8 kg   . sodium chloride 0.9 % bolus 1,000 mL     Order Specific Question:   Total Body Weight basis for 30 mL/kg  bolus delivery     Answer:   72.8 kg   . sodium chloride 0.9 % bolus 250 mL     Order Specific Question:   Total Body Weight basis for 30 mL/kg  bolus delivery     Answer:   72.8 kg  . DISCONTD: clindamycin (CLEOCIN) IVPB 900 mg    Order Specific Question:   Antibiotic Indication:    Answer:   Other Indication (list below)    Order Specific Question:   Other Indication:    Answer:   Endometritis  . oxyCODONE-acetaminophen (PERCOCET/ROXICET) 5-325 MG per tablet 2 tablet  . gentamicin (GARAMYCIN) 120 mg, clindamycin (CLEOCIN) 900 mg in dextrose 5 % 100 mL IVPB  Order Specific Question:   Indication:    Answer:   Endometritis   Discussed Hx, labs,  exam w/ Dr. Adrian BlackwaterStinson. Agrees w/ POC. New orders: US, Quant, Gent/Clinda.   1855: back from US. Now complaining of intermittent dizziness. Orthostatic VS and labs stable. Likely from Percocet.   Discussed US, labs, w/ Dr. Adrian BlackwaterStinson. Pt needs D/C. Since she is afebrile, Nml lactic acid and  Tachycardia improved will do in am. Discussed R/B/I D&C vs expectant management in pt w/ endometritis. Questions addressed. Pt agrees w/ POC. Will transfer to 3rd floor.   MDM - Endometritis 2/2 retained POCs. Stable for D&C in am. Continue Gent, Clinda, IV fluids, antipyretics, pain meds, tonight.    ASSESSMENT 1. Spontaneous abortion with infection  2. Endometritis   3. Tachycardia     PLAN Admit to third floor fer consult w/ Dr/ Adrian BlackwaterStinson. Benn MoulderGent, Clinda Tylenol IV fluids  [place], [place], [place] 01/04/2019  8:45 PM

## 2019-01-04 NOTE — MAU Note (Signed)
Mary Donaldson is a 33 y.o. here in MAU reporting: back pain. States she is post SAB and was sent home with Rx for pain meds but they aren't working. States she also has a headache. Scant bleeding.   Pain score: headache 9/10 and back pain 7/10

## 2019-01-04 NOTE — Progress Notes (Signed)
Pharmacy Antibiotic Note  Mary Donaldson is a 33 y.o. female admitted on 01/04/2019 with symptoms consistent with endometritis. Pt is s/p SAB on 1/27 at [redacted]w[redacted]d.Pharmacy has been consulted for Gentamicn dosing.  Plan: Gentamicin 120mg  IV q8h  Will continue to follow and assess need for further workup based on duration and pt's clinical status  Height: 5\' 3"  (160 cm) Weight: 160 lb 8 oz (72.8 kg) IBW/kg (Calculated) : 52.4  Adjusted/Dosing weight: 58.5kg  Temp (24hrs), Avg:100.4 F (38 C), Min:100.4 F (38 C), Max:100.4 F (38 C)  Recent Labs  Lab 01/01/19 0543 01/04/19 1654  WBC 21.7* 16.2*  CREATININE  --  0.54    Estimated Creatinine Clearance: 98.4 mL/min (by C-G formula based on SCr of 0.54 mg/dL).    No Known Allergies  Antimicrobials this admission: Clindamycin 900mg  IV q8h  1/28 >>>    Microbiology results: 1/28 BCx:          BCx 1/28 UCx:    Thank you for allowing pharmacy to be a part of this patient's care.  Claybon Jabs 01/04/2019 6:03 PM

## 2019-01-05 ENCOUNTER — Inpatient Hospital Stay (HOSPITAL_COMMUNITY): Payer: Medicaid Other | Admitting: Registered Nurse

## 2019-01-05 ENCOUNTER — Ambulatory Visit: Admit: 2019-01-05 | Payer: Medicaid Other | Admitting: Obstetrics and Gynecology

## 2019-01-05 ENCOUNTER — Encounter (HOSPITAL_COMMUNITY)
Admission: AD | Disposition: A | Discharge status: Home / Self Care | Payer: Self-pay | Source: Home / Self Care | Attending: Family Medicine

## 2019-01-05 ENCOUNTER — Encounter (HOSPITAL_COMMUNITY): Payer: Self-pay | Admitting: Registered Nurse

## 2019-01-05 DIAGNOSIS — O0882 Sepsis following ectopic and molar pregnancy: Secondary | ICD-10-CM

## 2019-01-05 DIAGNOSIS — O021 Missed abortion: Secondary | ICD-10-CM

## 2019-01-05 DIAGNOSIS — N719 Inflammatory disease of uterus, unspecified: Secondary | ICD-10-CM

## 2019-01-05 HISTORY — PX: DILATION AND EVACUATION: SHX1459

## 2019-01-05 LAB — CBC WITH DIFFERENTIAL/PLATELET
Basophils Absolute: 0 10*3/uL (ref 0.0–0.1)
Basophils Relative: 0 %
Eosinophils Absolute: 0 10*3/uL (ref 0.0–0.5)
Eosinophils Relative: 0 %
HCT: 27 % — ABNORMAL LOW (ref 36.0–46.0)
Hemoglobin: 8.5 g/dL — ABNORMAL LOW (ref 12.0–15.0)
Lymphocytes Relative: 10 %
Lymphs Abs: 1.5 10*3/uL (ref 0.7–4.0)
MCH: 18.6 pg — ABNORMAL LOW (ref 26.0–34.0)
MCHC: 31.5 g/dL (ref 30.0–36.0)
MCV: 59.1 fL — ABNORMAL LOW (ref 80.0–100.0)
MONO ABS: 0.6 10*3/uL (ref 0.1–1.0)
MONOS PCT: 4 %
Neutro Abs: 13.1 10*3/uL — ABNORMAL HIGH (ref 1.7–7.7)
Neutrophils Relative %: 86 %
PLATELETS: 267 10*3/uL (ref 150–400)
RBC: 4.57 MIL/uL (ref 3.87–5.11)
RDW: 18.3 % — AB (ref 11.5–15.5)
WBC: 15.2 10*3/uL — ABNORMAL HIGH (ref 4.0–10.5)
nRBC: 0 % (ref 0.0–0.2)

## 2019-01-05 LAB — COMPREHENSIVE METABOLIC PANEL
ALT: 12 IU/L (ref 0–32)
AST: 14 IU/L (ref 0–40)
Albumin/Globulin Ratio: 1.5 (ref 1.2–2.2)
Albumin: 4.1 g/dL (ref 3.9–5.0)
Alkaline Phosphatase: 74 IU/L (ref 39–117)
BUN/Creatinine Ratio: 11 (ref 9–23)
BUN: 6 mg/dL (ref 6–20)
Bilirubin Total: 0.3 mg/dL (ref 0.0–1.2)
CO2: 19 mmol/L — ABNORMAL LOW (ref 20–29)
CREATININE: 0.54 mg/dL — AB (ref 0.57–1.00)
Calcium: 10 mg/dL (ref 8.7–10.2)
Chloride: 101 mmol/L (ref 96–106)
GFR calc Af Amer: 146 mL/min/{1.73_m2} (ref >59)
GFR calc non Af Amer: 127 mL/min/{1.73_m2} (ref >59)
Globulin, Total: 2.8 g/dL (ref 1.5–4.5)
Glucose: 77 mg/dL (ref 65–99)
POTASSIUM: 4.4 mmol/L (ref 3.5–5.2)
SODIUM: 134 mmol/L (ref 134–144)
Total Protein: 6.9 g/dL (ref 6.0–8.5)

## 2019-01-05 LAB — CBC
Hematocrit: 33.4 % — ABNORMAL LOW (ref 34.0–46.6)
Hemoglobin: 10.6 g/dL — ABNORMAL LOW (ref 11.1–15.9)
MCH: 18.9 pg — ABNORMAL LOW (ref 26.6–33.0)
MCHC: 31.7 g/dL (ref 31.5–35.7)
MCV: 60 fL — ABNORMAL LOW (ref 79–97)
Platelets: 409 10*3/uL (ref 150–450)
RBC: 5.61 x10E6/uL — ABNORMAL HIGH (ref 3.77–5.28)
RDW: 19.9 % — ABNORMAL HIGH (ref 11.7–15.4)
WBC: 16.9 10*3/uL — ABNORMAL HIGH (ref 3.4–10.8)

## 2019-01-05 LAB — CYSTIC FIBROSIS MUTATION 97: Interpretation: NOT DETECTED

## 2019-01-05 LAB — SMN1 COPY NUMBER ANALYSIS (SMA CARRIER SCREENING)

## 2019-01-05 LAB — TSH: TSH: 3.44 u[IU]/mL (ref 0.450–4.500)

## 2019-01-05 SURGERY — DILATION AND EVACUATION, UTERUS
Anesthesia: General | Site: Vagina

## 2019-01-05 MED ORDER — ACETAMINOPHEN 160 MG/5ML PO SOLN: 325.0000 mg | ORAL | Status: DC | PRN

## 2019-01-05 MED ORDER — FENTANYL CITRATE (PF) 100 MCG/2ML IJ SOLN: 25.0000 ug | INTRAMUSCULAR | Status: DC | PRN

## 2019-01-05 MED ORDER — PROPOFOL 10 MG/ML IV BOLUS
INTRAVENOUS | Status: AC
  Filled 2019-01-05: qty 20

## 2019-01-05 MED ORDER — LIDOCAINE HCL (CARDIAC) PF 100 MG/5ML IV SOSY
PREFILLED_SYRINGE | INTRAVENOUS | Status: AC
  Filled 2019-01-05: qty 5

## 2019-01-05 MED ORDER — ACETAMINOPHEN 325 MG PO TABS: 325.0000 mg | ORAL_TABLET | ORAL | Status: DC | PRN

## 2019-01-05 MED ORDER — PROPOFOL 10 MG/ML IV BOLUS
INTRAVENOUS | Status: DC | PRN
  Administered 2019-01-05: 150 mg via INTRAVENOUS

## 2019-01-05 MED ORDER — MEPERIDINE HCL 25 MG/ML IJ SOLN: 6.2500 mg | INTRAMUSCULAR | Status: DC | PRN

## 2019-01-05 MED ORDER — FENTANYL CITRATE (PF) 100 MCG/2ML IJ SOLN
INTRAMUSCULAR | Status: AC
  Filled 2019-01-05: qty 2

## 2019-01-05 MED ORDER — MIDAZOLAM HCL 2 MG/2ML IJ SOLN
INTRAMUSCULAR | Status: AC
  Filled 2019-01-05: qty 2

## 2019-01-05 MED ORDER — CHLOROPROCAINE HCL 1 % IJ SOLN
INTRAMUSCULAR | Status: DC | PRN
  Administered 2019-01-05: 10 mL

## 2019-01-05 MED ORDER — ONDANSETRON HCL 4 MG/2ML IJ SOLN
INTRAMUSCULAR | Status: DC | PRN
  Administered 2019-01-05: 4 mg via INTRAVENOUS

## 2019-01-05 MED ORDER — FENTANYL CITRATE (PF) 100 MCG/2ML IJ SOLN
INTRAMUSCULAR | Status: DC | PRN
  Administered 2019-01-05 (×2): 50 ug via INTRAVENOUS

## 2019-01-05 MED ORDER — IBUPROFEN 600 MG PO TABS
600.0000 mg | ORAL_TABLET | Freq: Four times a day (QID) | ORAL | Status: DC | PRN
  Administered 2019-01-05 – 2019-01-06 (×4): 600 mg via ORAL
  Filled 2019-01-05 (×4): qty 1

## 2019-01-05 MED ORDER — DEXAMETHASONE SODIUM PHOSPHATE 10 MG/ML IJ SOLN
INTRAMUSCULAR | Status: AC
  Filled 2019-01-05: qty 1

## 2019-01-05 MED ORDER — KETOROLAC TROMETHAMINE 30 MG/ML IJ SOLN
INTRAMUSCULAR | Status: AC
  Filled 2019-01-05: qty 1

## 2019-01-05 MED ORDER — DEXAMETHASONE SODIUM PHOSPHATE 10 MG/ML IJ SOLN
INTRAMUSCULAR | Status: DC | PRN
  Administered 2019-01-05: 10 mg via INTRAVENOUS

## 2019-01-05 MED ORDER — ONDANSETRON HCL 4 MG/2ML IJ SOLN: 4.0000 mg | Freq: Once | INTRAMUSCULAR | Status: DC | PRN

## 2019-01-05 MED ORDER — ONDANSETRON HCL 4 MG/2ML IJ SOLN
INTRAMUSCULAR | Status: AC
  Filled 2019-01-05: qty 2

## 2019-01-05 MED ORDER — OXYCODONE HCL 5 MG/5ML PO SOLN: 5.0000 mg | Freq: Once | ORAL | Status: DC | PRN

## 2019-01-05 MED ORDER — KETOROLAC TROMETHAMINE 30 MG/ML IJ SOLN
INTRAMUSCULAR | Status: DC | PRN
  Administered 2019-01-05: 30 mg via INTRAVENOUS

## 2019-01-05 MED ORDER — MIDAZOLAM HCL 5 MG/5ML IJ SOLN
INTRAMUSCULAR | Status: DC | PRN
  Administered 2019-01-05: 2 mg via INTRAVENOUS

## 2019-01-05 MED ORDER — LIDOCAINE 2% (20 MG/ML) 5 ML SYRINGE
INTRAMUSCULAR | Status: DC | PRN
  Administered 2019-01-05: 60 mg via INTRAVENOUS

## 2019-01-05 MED ORDER — ACETAMINOPHEN 500 MG PO TABS
1000.0000 mg | ORAL_TABLET | Freq: Four times a day (QID) | ORAL | Status: DC | PRN
  Administered 2019-01-05 – 2019-01-06 (×2): 1000 mg via ORAL
  Filled 2019-01-05 (×2): qty 2

## 2019-01-05 MED ORDER — SODIUM CHLORIDE 0.9 % IV BOLUS
1000.0000 mL | Freq: Once | INTRAVENOUS | Status: AC
  Administered 2019-01-05: 1000 mL via INTRAVENOUS

## 2019-01-05 MED ORDER — OXYCODONE HCL 5 MG PO TABS: 5.0000 mg | ORAL_TABLET | Freq: Once | ORAL | Status: DC | PRN

## 2019-01-05 SURGICAL SUPPLY — 21 items
CATH ROBINSON RED A/P 16FR (CATHETERS) ×3 IMPLANT
DECANTER SPIKE VIAL GLASS SM (MISCELLANEOUS) ×3 IMPLANT
GLOVE BIOGEL PI IND STRL 6.5 (GLOVE) ×1 IMPLANT
GLOVE BIOGEL PI IND STRL 7.0 (GLOVE) ×1 IMPLANT
GLOVE BIOGEL PI INDICATOR 6.5 (GLOVE) ×2
GLOVE BIOGEL PI INDICATOR 7.0 (GLOVE) ×2
GLOVE SURG SS PI 6.0 STRL IVOR (GLOVE) ×3 IMPLANT
GOWN STRL REUS W/TWL LRG LVL3 (GOWN DISPOSABLE) ×6 IMPLANT
HIBICLENS CHG 4% 4OZ BTL (MISCELLANEOUS) ×3 IMPLANT
KIT BERKELEY 1ST TRIMESTER 3/8 (MISCELLANEOUS) ×3 IMPLANT
NS IRRIG 1000ML POUR BTL (IV SOLUTION) ×3 IMPLANT
PACK VAGINAL MINOR WOMEN LF (CUSTOM PROCEDURE TRAY) ×3 IMPLANT
PAD OB MATERNITY 4.3X12.25 (PERSONAL CARE ITEMS) ×3 IMPLANT
PAD PREP 24X48 CUFFED NSTRL (MISCELLANEOUS) ×3 IMPLANT
SET BERKELEY SUCTION TUBING (SUCTIONS) ×3 IMPLANT
TOWEL OR 17X24 6PK STRL BLUE (TOWEL DISPOSABLE) ×6 IMPLANT
VACURETTE 10 RIGID CVD (CANNULA) IMPLANT
VACURETTE 6 ASPIR F TIP BERK (CANNULA) IMPLANT
VACURETTE 7MM CVD STRL WRAP (CANNULA) IMPLANT
VACURETTE 8 RIGID CVD (CANNULA) IMPLANT
VACURETTE 9 RIGID CVD (CANNULA) IMPLANT

## 2019-01-05 NOTE — Anesthesia Preprocedure Evaluation (Signed)
Anesthesia Evaluation  Patient identified by MRN, date of birth, ID band Patient awake    Reviewed: Allergy & Precautions, H&P , NPO status , Patient's Chart, lab work & pertinent test results, reviewed documented beta blocker date and time   Airway Mallampati: II  TM Distance: >3 FB Neck ROM: full    Dental no notable dental hx.    Pulmonary neg pulmonary ROS,    Pulmonary exam normal breath sounds clear to auscultation       Cardiovascular Exercise Tolerance: Good hypertension, negative cardio ROS   Rhythm:regular Rate:Normal     Neuro/Psych negative neurological ROS  negative psych ROS   GI/Hepatic negative GI ROS, Neg liver ROS,   Endo/Other  negative endocrine ROS  Renal/GU negative Renal ROS  negative genitourinary   Musculoskeletal   Abdominal   Peds  Hematology negative hematology ROS (+)   Anesthesia Other Findings   Reproductive/Obstetrics negative OB ROS                             Anesthesia Physical Anesthesia Plan  ASA: II  Anesthesia Plan: General   Post-op Pain Management:    Induction: Intravenous  PONV Risk Score and Plan: 3 and Ondansetron, Dexamethasone, Treatment may vary due to age or medical condition and Midazolam  Airway Management Planned: LMA and Oral ETT  Additional Equipment:   Intra-op Plan:   Post-operative Plan:   Informed Consent: I have reviewed the patients History and Physical, chart, labs and discussed the procedure including the risks, benefits and alternatives for the proposed anesthesia with the patient or authorized representative who has indicated his/her understanding and acceptance.     Dental Advisory Given  Plan Discussed with: CRNA, Anesthesiologist and Surgeon  Anesthesia Plan Comments: ( )        Anesthesia Quick Evaluation

## 2019-01-05 NOTE — Progress Notes (Signed)
Subjective: Patient reports feeling better. She denies any abdominal pain. She describes her vaginal bleeding as spotting noted when wiping. She is without complaints     Objective: I have reviewed patient's vital signs. Today's Vitals   01/05/19 0442 01/05/19 0558 01/05/19 0714 01/05/19 0837  BP: (!) 91/53  101/60   Pulse: (!) 141  (!) 120   Resp: 19  18   Temp: (!) 100.5 F (38.1 C) 98.6 F (37 C) 98.7 F (37.1 C)   TempSrc: Oral Oral Oral   SpO2: 99%  100%   Weight:      Height:      PainSc:    7    Body mass index is 28.43 kg/m.   GENERAL: Well-developed, well-nourished female in no acute distress.  NECK: Supple. Normal thyroid.  LUNGS: Clear to auscultation bilaterally.  HEART: Regular rate and rhythm. ABDOMEN: Soft, nontender, nondistended.  PELVIC: Deferred to OR EXTREMITIES: No cyanosis, clubbing, or edema, 2+ distal pulses.  [place] Ob Comp Less 14 Wks  Result Date: 01/04/2019 CLINICAL DATA:  Follow-up spontaneous miscarriage with sepsis, continued bleeding. EXAM: OBSTETRIC <14 WK ULTRASOUND TECHNIQUE: Transabdominal ultrasound was performed for evaluation of the gestation as well as the maternal uterus and adnexal regions. COMPARISON:  Obstetrical ultrasound January 03, 2019. FINDINGS: Intrauterine gestational sac: Not present. Yolk sac:  Not present. Embryo:  Not present. Cardiac Activity: Not applicable. Subchorionic hemorrhage:  None visualized. Maternal uterus/adnexae: 3.6 cm thickened heterogeneously echogenic endometrium similar to prior ultrasound with increased vascularity. Normal appearance of the adnexa. No free fluid. IMPRESSION: 1. No sonographically identified IUP. 2. Similar thickened debris within endometrium seen with blood products or retained products of conception. Recommend serial HCG and follow-up ultrasound as clinically indicated. Electronically Signed   By: Awilda Metroourtnay  Bloomer M.D.   On: 01/04/2019 18:58   [place] Ob Comp Less 14 Wks  Result Date:  01/03/2019 CLINICAL DATA:  Vaginal bleeding in 1st trimester pregnancy. EXAM: OBSTETRIC <14 WK ULTRASOUND TECHNIQUE: Transabdominal ultrasound was performed for evaluation of the gestation as well as the maternal uterus and adnexal regions. COMPARISON:  01/01/2019 FINDINGS: Intrauterine gestational sac: None; previously seen IUP is no longer visualized Maternal uterus/adnexae: Endometrium is heterogeneous and measures 37 mm in thickness. Both ovaries are normal appearance. No mass or abnormal free fluid identified. IMPRESSION: Previous IUP no longer visualized, consistent with interval spontaneous abortion. Electronically Signed   By: Myles RosenthalJohn  Stahl M.D.   On: 01/03/2019 16:07   [place] Ob Comp Less 14 Wks  Result Date: 01/01/2019 CLINICAL DATA:  Vaginal bleeding EXAM: OBSTETRIC <14 WK ULTRASOUND TECHNIQUE: Transabdominal ultrasound was performed for evaluation of the gestation as well as the maternal uterus and adnexal regions. COMPARISON:  None. FINDINGS: Intrauterine gestational sac: Single Yolk sac:  Not seen Embryo:  Visualized. Cardiac Activity: Visualized. Heart Rate: 169 bpm MSD:    mm    w     d CRL:   58 mm   12 w 2 d                  US EDC: 07/14/2019 Subchorionic hemorrhage:  Small Maternal uterus/adnexae: Maternal ovaries appear normal and there is no mass or free fluid identified within either adnexal region. IMPRESSION: 1. Single live intrauterine pregnancy with estimated gestational age of [redacted] weeks and 2 days. 2. Small subchorionic hemorrhage. 3. Maternal ovaries appear normal and there is no mass or free fluid seen within either adnexal region. Electronically Signed   By: Anne NgStan  Maynard M.D.  On: 01/01/2019 11:01    Assessment/Plan: 33 yo G2P0020 with retained products of conception and sepsis following spontaneous first trimester miscarriage - Discussed surgical management with D&E. Risks, benefits and alternatives were explained including but not limited to risks of bleeding, infection, uterine  perforation, and damage to adjacent organs. Patient verbalized understanding and all questions were answered. - Continue IV antibiotics post procedure   LOS: 1 day    Cali Cuartas 01/05/2019, 9:10 AM

## 2019-01-05 NOTE — Op Note (Signed)
Mary Donaldson PROCEDURE DATE: 01/05/2019  PREOPERATIVE DIAGNOSIS: Septic missed abortion. POSTOPERATIVE DIAGNOSIS: The same. PROCEDURE:     Dilation and Evacuation. SURGEON:  Dr. Catalina Antigua  INDICATIONS: 33 y.o. G2P0010 with septic abortion with retaine placenta, needing surgical completion.  Risks of surgery were discussed with the patient including but not limited to: bleeding which may require transfusion; infection which may require antibiotics; injury to uterus or surrounding organs;need for additional procedures including laparotomy or laparoscopy; possibility of intrauterine scarring which may impair future fertility; and other postoperative/anesthesia complications. Written informed consent was obtained.    FINDINGS:  A 9-week size anteverted uterus, moderate amounts of products of conception, specimen sent to pathology.  ANESTHESIA:    General anesthesia, paracervical block. INTRAVENOUS FLUIDS:  700 ml of LR ESTIMATED BLOOD LOSS:  100 ml. SPECIMENS:  Products of conception sent to pathology COMPLICATIONS:  None immediate.  PROCEDURE DETAILS:  The patient received intravenous antibiotics prior to entering the preoperative area.  She was then taken to the operating room where general anesthesia was administered and was found to be adequate.  After an adequate timeout was performed, she was placed in the dorsal lithotomy position and examined; then prepped and draped in the sterile manner.   Her bladder was catheterized for an unmeasured amount of clear, yellow urine. A vaginal speculum was then placed in the patient's vagina and a single tooth tenaculum was applied to the anterior lip of the cervix.  A paracervical block using 0.5% Marcaine was administered. The cervix was gently dilated to accommodate a 9 mm suction curette that was gently advanced to the uterine fundus.  The suction device was then activated and curette slowly rotated to clear the uterus of products of conception.  A  sharp curettage was then performed to confirm complete emptying of the uterus. There was minimal bleeding noted and the tenaculum removed with good hemostasis noted.   All instruments were removed from the patient's vagina. The patient tolerated the procedure well and was taken to the recovery area awake, and in stable condition.  The patient will return to med-surg floor for continued inpatient management of septic abortion

## 2019-01-05 NOTE — Anesthesia Procedure Notes (Signed)
Procedure Name: LMA Insertion Date/Time: 01/05/2019 9:17 AM Performed by: Jhonnie Garner, CRNA Pre-anesthesia Checklist: Patient identified, Emergency Drugs available, Suction available and Patient being monitored Patient Re-evaluated:Patient Re-evaluated prior to induction Oxygen Delivery Method: Circle system utilized Preoxygenation: Pre-oxygenation with 100% oxygen Induction Type: IV induction Ventilation: Mask ventilation without difficulty LMA: LMA inserted LMA Size: 4.0 Number of attempts: 1 Placement Confirmation: positive ETCO2 and breath sounds checked- equal and bilateral Tube secured with: Tape Dental Injury: Teeth and Oropharynx as per pre-operative assessment

## 2019-01-05 NOTE — Anesthesia Postprocedure Evaluation (Signed)
Anesthesia Post Note  Patient: Mary Donaldson  Procedure(s) Performed: DILATATION AND EVACUATION (N/A Vagina )     Patient location during evaluation: PACU Anesthesia Type: General Level of consciousness: awake and alert Pain management: pain level controlled Vital Signs Assessment: post-procedure vital signs reviewed and stable Respiratory status: spontaneous breathing, nonlabored ventilation, respiratory function stable and patient connected to nasal cannula oxygen Cardiovascular status: blood pressure returned to baseline and stable Postop Assessment: no apparent nausea or vomiting Anesthetic complications: no    Last Vitals:  Vitals:   01/05/19 1125 01/05/19 1236  BP: 103/67 108/69  Pulse: 99 (!) 102  Resp:  20  Temp: 36.9 C 36.6 C  SpO2: 99% 100%    Last Pain:  Vitals:   01/05/19 1240  TempSrc:   PainSc: 2    Pain Goal: Patients Stated Pain Goal: 7 (01/05/19 1240)                 Ashlye Oviedo

## 2019-01-05 NOTE — Progress Notes (Signed)
Patients vital signs were taken this morning. HR was high and BP low. Running a fever as well. Orders for tylenol and fluid bolus. Labs will be drawn as well

## 2019-01-05 NOTE — Transfer of Care (Signed)
Immediate Anesthesia Transfer of Care Note  Patient: Mary Donaldson  Procedure(s) Performed: DILATATION AND EVACUATION (N/A Vagina )  Patient Location: PACU  Anesthesia Type:General  Level of Consciousness: awake, alert  and oriented  Airway & Oxygen Therapy: Patient Spontanous Breathing and Patient connected to nasal cannula oxygen  Post-op Assessment: Report given to RN and Post -op Vital signs reviewed and stable  Post vital signs: Reviewed and stable  Last Vitals:  Vitals Value Taken Time  BP    Temp    Pulse    Resp    SpO2      Last Pain:  Vitals:   01/05/19 0837  TempSrc:   PainSc: 7          Complications: No apparent anesthesia complications

## 2019-01-06 ENCOUNTER — Encounter (HOSPITAL_COMMUNITY): Payer: Self-pay | Admitting: Obstetrics and Gynecology

## 2019-01-06 LAB — GENTAMICIN LEVEL, TROUGH: GENTAMICIN TR: 0.9 ug/mL (ref 0.5–2.0)

## 2019-01-06 LAB — CBC
HCT: 25.6 % — ABNORMAL LOW (ref 36.0–46.0)
HEMOGLOBIN: 8.3 g/dL — AB (ref 12.0–15.0)
MCH: 18.9 pg — ABNORMAL LOW (ref 26.0–34.0)
MCHC: 32.4 g/dL (ref 30.0–36.0)
MCV: 58.2 fL — ABNORMAL LOW (ref 80.0–100.0)
Platelets: 252 10*3/uL (ref 150–400)
RBC: 4.4 MIL/uL (ref 3.87–5.11)
RDW: 18.1 % — ABNORMAL HIGH (ref 11.5–15.5)
WBC: 12.9 10*3/uL — ABNORMAL HIGH (ref 4.0–10.5)
nRBC: 0 % (ref 0.0–0.2)

## 2019-01-06 LAB — URINE CULTURE: Special Requests: NORMAL

## 2019-01-06 MED ORDER — DEXTROMETHORPHAN POLISTIREX ER 30 MG/5ML PO SUER
30.0000 mg | Freq: Two times a day (BID) | ORAL | Status: DC
  Administered 2019-01-06: 30 mg via ORAL
  Filled 2019-01-06 (×2): qty 5

## 2019-01-06 MED ORDER — GUAIFENESIN 100 MG/5ML PO SOLN
10.0000 mL | ORAL | Status: DC | PRN
  Administered 2019-01-06 – 2019-01-08 (×4): 200 mg via ORAL
  Filled 2019-01-06 (×5): qty 10

## 2019-01-06 MED ORDER — ZOLPIDEM TARTRATE 5 MG PO TABS
5.0000 mg | ORAL_TABLET | Freq: Every evening | ORAL | Status: DC | PRN
  Administered 2019-01-06 – 2019-01-07 (×2): 5 mg via ORAL
  Filled 2019-01-06 (×2): qty 1

## 2019-01-06 MED ORDER — GUAIFENESIN 100 MG/5ML PO SOLN: 5.0000 mL | ORAL | Status: DC | PRN

## 2019-01-06 MED ORDER — GUAIFENESIN-DM 100-10 MG/5ML PO SYRP
10.0000 mL | ORAL_SOLUTION | ORAL | Status: DC | PRN
  Administered 2019-01-06: 5 mL via ORAL
  Filled 2019-01-06 (×2): qty 10

## 2019-01-06 MED ORDER — DEXTROMETHORPHAN POLISTIREX ER 30 MG/5ML PO SUER
15.0000 mg | Freq: Two times a day (BID) | ORAL | Status: DC
  Administered 2019-01-07 – 2019-01-09 (×4): 15 mg via ORAL
  Filled 2019-01-06 (×6): qty 5

## 2019-01-06 MED ORDER — GUAIFENESIN 100 MG/5ML PO SOLN
15.0000 mL | Freq: Four times a day (QID) | ORAL | Status: DC | PRN
  Administered 2019-01-06 (×2): 300 mg via ORAL
  Filled 2019-01-06 (×3): qty 15

## 2019-01-06 NOTE — Progress Notes (Signed)
Dr. Earlene Plater notified of pts current condition. High HR and Low O2 saturation. Nasal canula placed with 3 L. Cough continues even with the cough suppressants. Dr. Earlene Plater in unit to see patient.

## 2019-01-06 NOTE — Progress Notes (Signed)
Nurse called into room due to pt having chills and a new onset of cough. HR and temp. are elevated. Dr. Despina Hidden notified but on the way to a c-section.

## 2019-01-06 NOTE — Progress Notes (Addendum)
Pharmacy Antibiotic Note  Mary Donaldson is a 33 y.o. female admitted on 01/04/2019 with symptoms consistent with endometritis. Pharmacy is dosing her gentamicin.   Pt currently receiving gentamicin 120mg  IV q8hr  Trough: 0.55mcg/ml @ 0946 on 01/06/2019 (goal trough = < 33mcg/ml)   Plan: Continue current dose of gentamicin 120mg  IV q 8 hr.  Height: 5\' 3"  (160 cm) Weight: 160 lb 7.9 oz (72.8 kg) IBW/kg (Calculated) : 52.4  Temp (24hrs), Avg:98.4 F (36.9 C), Min:97.5 F (36.4 C), Max:100.4 F (38 C)  Recent Labs  Lab 01/01/19 0543 01/04/19 1654 01/04/19 1750 01/04/19 1959 01/05/19 0521 01/06/19 0837 01/06/19 0946  WBC 21.7* 16.2*  --   --  15.2* 12.9*  --   CREATININE  --  0.54  --   --   --   --   --   LATICACIDVEN  --   --  1.2 0.6  --   --   --   GENTTROUGH  --   --   --   --   --   --  0.9    Estimated Creatinine Clearance: 98.4 mL/min (by C-G formula based on SCr of 0.54 mg/dL).    No Known Allergies  Antimicrobials this admission: Clindamycin 900 mg IV q8 hr.    Pharmacy will continue to follow. Thank you for allowing pharmacy to be a part of this patient's care.  Sherrilyn Rist 01/06/2019 11:41 AM

## 2019-01-06 NOTE — Progress Notes (Signed)
Subjective: Patient reports feeling well.  She reports some mild cramping pain. She ambulated in the hall way once  Objective: I have reviewed patient's vital signs. Today's Vitals   01/06/19 0610 01/06/19 0622 01/06/19 0734 01/06/19 0800  BP:      Pulse:    (!) 120  Resp:      Temp:  97.9 F (36.6 C)    TempSrc:  Oral    SpO2: 96%   97%  Weight:      Height:      PainSc:   Asleep 0-No pain   Body mass index is 28.43 kg/m.   General: alert, cooperative and no distress Resp: clear to auscultation bilaterally Cardio: regular rate and rhythm GI: soft, non-tender; bowel sounds normal; no masses,  no organomegaly Extremities: extremities normal, atraumatic, no cyanosis or edema Vaginal Bleeding: minimal  CBC    Component Value Date/Time   WBC 12.9 (H) 01/06/2019 0837   RBC 4.40 01/06/2019 0837   HGB 8.3 (L) 01/06/2019 0837   HGB 10.6 (L) 12/27/2018 1456   HCT 25.6 (L) 01/06/2019 0837   HCT 33.4 (L) 12/27/2018 1456   PLT 252 01/06/2019 0837   PLT 409 12/27/2018 1456   MCV 58.2 (L) 01/06/2019 0837   MCV 60 (L) 12/27/2018 1456   MCH 18.9 (L) 01/06/2019 0837   MCHC 32.4 01/06/2019 0837   RDW 18.1 (H) 01/06/2019 0837   RDW 19.9 (H) 12/27/2018 1456   LYMPHSABS 1.5 01/05/2019 0521   MONOABS 0.6 01/05/2019 0521   EOSABS 0.0 01/05/2019 0521   BASOSABS 0.0 01/05/2019 0521     Assessment/Plan: 32 yo G2P0020 POD#1 s/p D&E with septic abortion - Patient with Tmax 100.4 at 2 am - Continue IV antibiotics - WBC trending down - patient's grandmother passed away this morning and the patient's mother who is at her bedside needs to travel to [place] for burial planning. Patient was hoping to be discharged today but understands that she may need to sign AMA form. Patient's mother advised her to stay inpatient as the patient has no plans to travel to [place] at this time - Continue current care - Encourage ambulation   LOS: 2 days    Tyonna Talerico 01/06/2019, 9:11  AM

## 2019-01-07 ENCOUNTER — Inpatient Hospital Stay (HOSPITAL_COMMUNITY): Payer: Medicaid Other

## 2019-01-07 DIAGNOSIS — J9601 Acute respiratory failure with hypoxia: Secondary | ICD-10-CM | POA: Diagnosis present

## 2019-01-07 LAB — CBC
HCT: 27.3 % — ABNORMAL LOW (ref 36.0–46.0)
Hemoglobin: 8.7 g/dL — ABNORMAL LOW (ref 12.0–15.0)
MCH: 18.3 pg — ABNORMAL LOW (ref 26.0–34.0)
MCHC: 31.9 g/dL (ref 30.0–36.0)
MCV: 57.5 fL — AB (ref 80.0–100.0)
Platelets: 308 10*3/uL (ref 150–400)
RBC: 4.75 MIL/uL (ref 3.87–5.11)
RDW: 18 % — ABNORMAL HIGH (ref 11.5–15.5)
WBC: 16.8 10*3/uL — ABNORMAL HIGH (ref 4.0–10.5)
nRBC: 0 % (ref 0.0–0.2)

## 2019-01-07 LAB — BASIC METABOLIC PANEL
ANION GAP: 8 (ref 5–15)
BUN: 6 mg/dL (ref 6–20)
CO2: 16 mmol/L — ABNORMAL LOW (ref 22–32)
Calcium: 7.9 mg/dL — ABNORMAL LOW (ref 8.9–10.3)
Chloride: 108 mmol/L (ref 98–111)
Creatinine, Ser: 0.65 mg/dL (ref 0.44–1.00)
GFR calc Af Amer: >60 mL/min (ref >60)
GFR calc non Af Amer: >60 mL/min (ref >60)
GLUCOSE: 162 mg/dL — AB (ref 70–99)
Potassium: 3.4 mmol/L — ABNORMAL LOW (ref 3.5–5.1)
Sodium: 132 mmol/L — ABNORMAL LOW (ref 135–145)

## 2019-01-07 LAB — PROCALCITONIN: Procalcitonin: 0.53 ng/mL

## 2019-01-07 LAB — PLASMODIUM SP. PCR: Plasmodium Sp. PCR: NEGATIVE

## 2019-01-07 LAB — LACTIC ACID, PLASMA: Lactic Acid, Venous: 1.3 mmol/L (ref 0.5–1.9)

## 2019-01-07 LAB — STREP PNEUMONIAE URINARY ANTIGEN: Strep Pneumo Urinary Antigen: NEGATIVE

## 2019-01-07 LAB — BRAIN NATRIURETIC PEPTIDE: B Natriuretic Peptide: 87.1 pg/mL (ref 0.0–100.0)

## 2019-01-07 MED ORDER — IOPAMIDOL (ISOVUE-370) INJECTION 76%: 100.0000 mL | Freq: Once | INTRAVENOUS | Status: DC | PRN

## 2019-01-07 MED ORDER — POTASSIUM CHLORIDE CRYS ER 20 MEQ PO TBCR
40.0000 meq | EXTENDED_RELEASE_TABLET | Freq: Two times a day (BID) | ORAL | Status: DC
  Filled 2019-01-07 (×2): qty 2

## 2019-01-07 MED ORDER — SODIUM CHLORIDE 0.9 % IV SOLN
1.0000 g | Freq: Two times a day (BID) | INTRAVENOUS | Status: DC
  Administered 2019-01-07: 1 g via INTRAVENOUS
  Filled 2019-01-07 (×2): qty 10

## 2019-01-07 MED ORDER — AMOXICILLIN-POT CLAVULANATE ER 1000-62.5 MG PO TB12
2.0000 | ORAL_TABLET | Freq: Two times a day (BID) | ORAL | Status: DC
  Administered 2019-01-07: 2 via ORAL
  Filled 2019-01-07: qty 2

## 2019-01-07 MED ORDER — POTASSIUM CHLORIDE CRYS ER 20 MEQ PO TBCR
40.0000 meq | EXTENDED_RELEASE_TABLET | Freq: Once | ORAL | Status: AC
  Administered 2019-01-07: 40 meq via ORAL
  Filled 2019-01-07: qty 2

## 2019-01-07 MED ORDER — ALBUTEROL SULFATE (2.5 MG/3ML) 0.083% IN NEBU
2.5000 mg | INHALATION_SOLUTION | Freq: Once | RESPIRATORY_TRACT | Status: AC
  Administered 2019-01-07: 2.5 mg via RESPIRATORY_TRACT
  Filled 2019-01-07: qty 3

## 2019-01-07 MED ORDER — SODIUM CHLORIDE 0.9 % IV SOLN: 3.0000 g | Freq: Four times a day (QID) | INTRAVENOUS | Status: DC

## 2019-01-07 MED ORDER — AZITHROMYCIN 250 MG PO TABS
500.0000 mg | ORAL_TABLET | Freq: Every day | ORAL | Status: AC
  Administered 2019-01-07: 500 mg via ORAL
  Filled 2019-01-07: qty 2

## 2019-01-07 MED ORDER — DOXYCYCLINE HYCLATE 100 MG PO TABS: 100.0000 mg | ORAL_TABLET | Freq: Two times a day (BID) | ORAL | Status: DC

## 2019-01-07 MED ORDER — AZITHROMYCIN 250 MG PO TABS
250.0000 mg | ORAL_TABLET | Freq: Every day | ORAL | Status: DC
  Administered 2019-01-08 – 2019-01-09 (×2): 250 mg via ORAL
  Filled 2019-01-07 (×3): qty 1

## 2019-01-07 MED ORDER — SODIUM CHLORIDE 0.9 % IV SOLN
1.5000 g | Freq: Four times a day (QID) | INTRAVENOUS | Status: DC
  Administered 2019-01-07 – 2019-01-09 (×7): 1.5 g via INTRAVENOUS
  Filled 2019-01-07 (×9): qty 1.5

## 2019-01-07 MED ORDER — FUROSEMIDE 10 MG/ML IJ SOLN
40.0000 mg | Freq: Four times a day (QID) | INTRAMUSCULAR | Status: DC
  Filled 2019-01-07 (×2): qty 4

## 2019-01-07 NOTE — Progress Notes (Signed)
Subjective: Patient reports persistent non productive cough. She denies fever, chills or nasal drainage. She is requesting to go home. Patient found sitting in chair with nasal canula. Patient states that she is not using the incentive spirometer because it is too difficult  Objective: Blood pressure 127/86, pulse (!) 129, temperature 100.3 F (37.9 C), temperature source Oral, resp. rate (!) 22, height 5\' 3"  (1.6 m), weight 72.8 kg, SpO2 98 %, unknown if currently breastfeeding.  General: alert, cooperative and no distress Resp: clear to auscultation bilaterally Cardio: regular rate and rhythm GI: soft, non-tender; bowel sounds normal; no masses,  no organomegaly Extremities: Homans sign is negative, no sign of DVT   Assessment/Plan: 33 yo POSD#2 s/p D&E due to septic ab now with suspected CAP - nebulizer treatment this morning - Start Augmentin and azythromycin - pulmonary consult - continue current care   LOS: 3 days    Mary Donaldson 01/07/2019, 10:09 AM

## 2019-01-07 NOTE — Progress Notes (Signed)
eLink Physician-Brief Progress Note Patient Name: Mary Donaldson DOB: 1986-05-19 MRN: [id number]   Date of Service  01/07/2019  HPI/Events of Note  Notified of episodes of non sustained SVT 150s. K 3.4 this morning with no correction noted.  eICU Interventions  Ordered K 40 meqs PO x 1. Discussed with bedside nurse to call back if SVT recurrent despite K correction     Intervention Category Major Interventions: Arrhythmia - evaluation and management Intermediate Interventions: Electrolyte abnormality - evaluation and management  Darl Pikes 01/07/2019, 9:07 PM

## 2019-01-07 NOTE — Consult Note (Signed)
NAMENephtalie Donaldson, MRN:  [id number], DOB:  [birth year], LOS: 3 ADMISSION DATE:  01/04/2019, CONSULTATION DATE:  1/31 REFERRING MD:  Gigi Gin, CHIEF COMPLAINT:  Cough ? CAP   Brief History   33 year old female admitted 1/28 1 day following spontaneous abortion with abdominal sepsis in setting of endometriosis and possible URI.  She underwent dilation and evacuation on 1/29, pulmonary symptoms worsened over the following days with ongoing cough and dyspnea as well as progressive hypoxia pulmonary asked to see on 1/31 for hypoxia and chest x-ray with diffuse bilateral airspace disease -Traveled to [place] in November History of present illness    33 year old female patient who who presented on 1/28 1 day after spontaneous 12-week abortion with fever of 100.3, chills, body ache fatigue and abdominal discomfort.  She was admitted with working diagnosis of abdominal sepsis following spontaneous abortion resulting in endometriosis, as well as URI.  Was started on empiric antibiotics, IV hydration was initiated, cultures obtained, and ultimately she went to the OR for dilation and evacuation on 1/29.  Her postoperative course has been complicated by ongoing fever and chills as well as nonproductive cough on 1/30 she was complaining of continued nonproductive cough worsening shortness of breath.  Pulse oximetry recorded saturations as low as 87% requiring supplemental oxygen to bring above 90.  A chest x-ray was obtained on 1/31 this demonstrated diffuse bilateral pulmonary infiltrates.  Pulmonary was asked to see due to concern for worsening respiratory failure  Past Medical History  HTN  Significant Hospital Events   1/28 : Admitted with working diagnosis of URI, fever, and abdominal sepsis secondary to endometriosis following spontaneous abortion; cultures obtained antibiotics initiated.  Apparently had been negative for flu swab the day prior 1/29 went to the operating room for dilation and evacuation.   Intubated via LMA  Consults:  Pulmonary consult 1/31  Procedures:  1/29 dilation and evacuation  Significant Diagnostic Tests:    Micro Data:  Urine culture 1/28 multiple species, lab suggesting recollection Blood culture 1/28 pending u strep 1/31>>> u legionella 1/31>>> RVP 1/31>>>  Antimicrobials:  azithromycin 1/31 Augmentin 1/31 Clindamycin 1/28 to 1/30 Gentamicin 1/28 to 1/30  Interim history/subjective:  Still coughing   Objective   Blood pressure 127/86, pulse (Abnormal) 129, temperature 100.3 F (37.9 C), temperature source Oral, resp. rate (Abnormal) 22, height 5\' 3"  (1.6 m), weight 72.8 kg, SpO2 98 %, unknown if currently breastfeeding.        Intake/Output Summary (Last 24 hours) at 01/07/2019 1140 Last data filed at 01/07/2019 4098 Gross per 24 hour  Intake no documentation  Output 1200 ml  Net -1200 ml   Filed Weights   01/04/19 1619 01/04/19 2135  Weight: 72.8 kg 72.8 kg    Examination: General: well developed 33 year old female sitting up in bed. No distress but does exhibit increased WOB HENT: NCAT no JVD MMM Lungs: basilar rales. Some increase in WOB Cardiovascular: tachy RRR  Abdomen: soft not tender + bowel sound  Extremities: no edema brisk CR  Neuro: awake and alert no focal def  GU: voiding   Resolved Hospital Problem list     Assessment & Plan:   Acute hypoxic respiratory failure in the setting of diffuse pulmonary infiltrates -Differential diagnosis includes: CAP viral and/or bacterial with evolving acute lung injury.  Had negative flu swab reportedly prior to admission.  Aspiration pneumonitis, pulmonary edema or negative pressure pulmonary edema; although both of these seem a little less likely given cough and respiratory symptoms  precluded hospital admission.  I do not think this is pulmonary emboli given we have excellent explanation of her hypoxia radiographically Plan Sputum culture if able And urinary strep and Legionella  antigen And respiratory viral panel Procalcitonin algorithm BNP and echo Changing antibiotics to a azithromycin and Unasyn for now instead of Augmentin A.m. chest x-ray Wean oxygen for saturations greater than 90% Incentive spirometry Moving to [facility] for further monitoring   Ongoing SIRS/sepsis -Initially from abdominal source, now concerned also about secondary pulmonary infection either from community-acquired pneumonia or aspiration event Plan Checking lactic acid Continuing IV fluids Follow-up CBC  Leukocytosis in setting of sepsis Plan Continue to trend CBC  Fluid and electrolyte imbalance: hyponatremia and hypokalemia Plan Cont NS Replace K   Blood loss anemia Plan Trend CBC   Best practice:  Diet: regular  Pain/Anxiety/Delirium protocol (if indicated): NA VAP protocol (if indicated): NA DVT prophylaxis: scd GI prophylaxis: NA Glucose control: NA Mobility: OOB Code Status: full code  Family Communication: NA Disposition:   Labs   CBC: Recent Labs  Lab 01/01/19 0543 01/04/19 1654 01/05/19 0521 01/06/19 0837  WBC 21.7* 16.2* 15.2* 12.9*  NEUTROABS 16.5* 14.2* 13.1*  --   HGB 9.6* 9.7* 8.5* 8.3*  HCT 31.2* 30.4* 27.0* 25.6*  MCV 60.6* 59.3* 59.1* 58.2*  PLT 332 295 267 252    Basic Metabolic Panel: Recent Labs  Lab 01/04/19 1654  NA 128*  K 3.4*  CL 102  CO2 18*  GLUCOSE 120*  BUN 6  CREATININE 0.54  CALCIUM 8.2*   GFR: Estimated Creatinine Clearance: 98.4 mL/min (by C-G formula based on SCr of 0.54 mg/dL). Recent Labs  Lab 01/01/19 0543 01/04/19 1654 01/04/19 1750 01/04/19 1959 01/05/19 0521 01/06/19 0837  WBC 21.7* 16.2*  --   --  15.2* 12.9*  LATICACIDVEN  --   --  1.2 0.6  --   --     Liver Function Tests: Recent Labs  Lab 01/04/19 1654  AST 32  ALT 28  ALKPHOS 85  BILITOT 0.3  PROT 6.5  ALBUMIN 3.1*   No results for input(s): LIPASE, AMYLASE in the last 168 hours. No results for input(s): AMMONIA in the last 168  hours.  ABG No results found for: PHART, PCO2ART, PO2ART, HCO3, TCO2, ACIDBASEDEF, O2SAT   Coagulation Profile: No results for input(s): INR, PROTIME in the last 168 hours.  Cardiac Enzymes: No results for input(s): CKTOTAL, CKMB, CKMBINDEX, TROPONINI in the last 168 hours.  HbA1C: No results found for: HGBA1C  CBG: No results for input(s): GLUCAP in the last 168 hours.  Review of Systems:   Review of Systems  Constitutional: Positive for chills and fever. Negative for diaphoresis and malaise/fatigue.  HENT: Positive for congestion.   Eyes: Negative.   Respiratory: Positive for cough and shortness of breath. Negative for wheezing.   Cardiovascular: Negative.   Gastrointestinal: Negative.   Genitourinary: Negative.   Musculoskeletal: Negative.   Skin: Negative.   Neurological: Negative.   Endo/Heme/Allergies: Negative.   Psychiatric/Behavioral: Negative.      Past Medical History  She,  has a past medical history of Hypertension.   Surgical History    Past Surgical History:  Procedure Laterality Date  . DILATION AND EVACUATION N/A 01/05/2019   Procedure: DILATATION AND EVACUATION;  Surgeon: Catalina Antigua, MD;  Location: [facility];  Service: Gynecology;  Laterality: N/A;     Social History   reports that she has never smoked. She has never used smokeless tobacco. She reports  that she does not drink alcohol or use drugs.   Family History   Her family history includes Diabetes in her mother; Hypertension in her mother.   Allergies No Known Allergies   Home Medications  Prior to Admission medications   Medication Sig Start Date End Date Taking? Authorizing Provider  cyclobenzaprine (FLEXERIL) 5 MG tablet Take 1 tablet (5 mg total) by mouth 3 (three) times daily as needed for muscle spasms. 01/03/19  Yes Arvilla Market, DO  labetalol (NORMODYNE) 100 MG tablet Take 1 tablet (100 mg total) by mouth 2 (two) times daily. 12/27/18  Yes Constant, Peggy, MD    Prenatal Vit-Fe Fumarate-FA (MULTIVITAMIN-PRENATAL) 27-0.8 MG TABS tablet Take 1 tablet by mouth daily at 12 noon. 12/27/18  Yes Constant, Peggy, MD  progesterone (PROMETRIUM) 200 MG capsule Take 1 capsule (200 mg total) by mouth daily. 12/27/18   Constant, Gigi Gin, MD     Critical care time:  31 min     Simonne Martinet ACNP-BC [facility] Pulmonary/Critical Care Pager # [phone] OR # [phone] if no answer

## 2019-01-07 NOTE — Progress Notes (Signed)
Faculty Note  Called by RN to eval patient for decreased O2 sats requiring [place] 3L.   Patient sleeping on arrival, states she feels much the same as she has, just feels like she starts coughing any time she lays down. Denies chest pain or other complaints.  BP 131/79 (BP Location: Left Arm)   Pulse (!) 138   Temp 99.2 F (37.3 C) (Oral)   Resp 20   Ht 5\' 3"  (1.6 m)   Wt 72.8 kg   SpO2 92%   Breastfeeding Unknown   BMI 28.43 kg/m  Gen: alert, oriented, cooperative SVD: tachycardic with regular rhythm Resp: increased work of breathing, sounds heard in all lobes with rales and rhonchi  A/P: 33 yo POD#2 s/p D&C for septic abortion, with URI, decreased O2 sats when she fell asleep this evening. Now tachycardic (similar to last evening) and appears very fatigued but only complaint is coughing. Will obtain CXR and EKG. Cont current management.   Baldemar Lenis, M.D. Attending Center for [employer] [facility])

## 2019-01-07 NOTE — Progress Notes (Signed)
Patient ID: Mary Donaldson, female   DOB: 12/07/1986, 33 y.o.   MRN: [id number] Patient with persistent cough and oxygen requirement. She is unable to lay down on bed. She denies any pelvic pain. Vaginal bleeding is minimal  Blood pressure 107/64, pulse (!) 124, temperature 98.8 F (37.1 C), temperature source Oral, resp. rate 20, height 5\' 3"  (1.6 m), weight 72.8 kg, SpO2 97 %, unknown if currently breastfeeding.  GENERAL: Well-developed, well-nourished female with mild respiratory distress.  LUNGS: Decreased breath sounds bilaterally.  HEART: Regular rate and rhythm. ABDOMEN: Soft, nontender, nondistended. No organomegaly. EXTREMITIES: No cyanosis, clubbing, or edema, 2+ distal pulses.  US Ob Comp Less 14 Wks  Result Date: 01/04/2019 CLINICAL DATA:  Follow-up spontaneous miscarriage with sepsis, continued bleeding. EXAM: OBSTETRIC <14 WK ULTRASOUND TECHNIQUE: Transabdominal ultrasound was performed for evaluation of the gestation as well as the maternal uterus and adnexal regions. COMPARISON:  Obstetrical ultrasound January 03, 2019. FINDINGS: Intrauterine gestational sac: Not present. Yolk sac:  Not present. Embryo:  Not present. Cardiac Activity: Not applicable. Subchorionic hemorrhage:  None visualized. Maternal uterus/adnexae: 3.6 cm thickened heterogeneously echogenic endometrium similar to prior ultrasound with increased vascularity. Normal appearance of the adnexa. No free fluid. IMPRESSION: 1. No sonographically identified IUP. 2. Similar thickened debris within endometrium seen with blood products or retained products of conception. Recommend serial HCG and follow-up ultrasound as clinically indicated. Electronically Signed   By: Awilda Metro M.D.   On: 01/04/2019 18:58   US Ob Comp Less 14 Wks  Result Date: 01/03/2019 CLINICAL DATA:  Vaginal bleeding in 1st trimester pregnancy. EXAM: OBSTETRIC <14 WK ULTRASOUND TECHNIQUE: Transabdominal ultrasound was performed for evaluation of the  gestation as well as the maternal uterus and adnexal regions. COMPARISON:  01/01/2019 FINDINGS: Intrauterine gestational sac: None; previously seen IUP is no longer visualized Maternal uterus/adnexae: Endometrium is heterogeneous and measures 37 mm in thickness. Both ovaries are normal appearance. No mass or abnormal free fluid identified. IMPRESSION: Previous IUP no longer visualized, consistent with interval spontaneous abortion. Electronically Signed   By: Myles Rosenthal M.D.   On: 01/03/2019 16:07   US Ob Comp Less 14 Wks  Result Date: 01/01/2019 CLINICAL DATA:  Vaginal bleeding EXAM: OBSTETRIC <14 WK ULTRASOUND TECHNIQUE: Transabdominal ultrasound was performed for evaluation of the gestation as well as the maternal uterus and adnexal regions. COMPARISON:  None. FINDINGS: Intrauterine gestational sac: Single Yolk sac:  Not seen Embryo:  Visualized. Cardiac Activity: Visualized. Heart Rate: 169 bpm MSD:    mm    w     d CRL:   58 mm   12 w 2 d                  [place] EDC: 07/14/2019 Subchorionic hemorrhage:  Small Maternal uterus/adnexae: Maternal ovaries appear normal and there is no mass or free fluid identified within either adnexal region. IMPRESSION: 1. Single live intrauterine pregnancy with estimated gestational age of [redacted] weeks and 2 days. 2. Small subchorionic hemorrhage. 3. Maternal ovaries appear normal and there is no mass or free fluid seen within either adnexal region. Electronically Signed   By: Bary Richard M.D.   On: 01/01/2019 11:01   Dg Chest Port 1 View  Result Date: 01/07/2019 CLINICAL DATA:  Miscarriage on 01/03/2019. Tachycardia and dry cough. Decreased oxygen saturation. EXAM: PORTABLE CHEST 1 VIEW COMPARISON:  None. FINDINGS: Shallow inspiration. Cardiac enlargement. Bilateral perihilar and basilar airspace infiltrates may represent edema, alveolar hemorrhage, or multifocal pneumonia. No blunting of costophrenic angles.  No pneumothorax. Mediastinal contours appear intact. IMPRESSION:  Cardiac enlargement with bilateral perihilar and basilar infiltrates. Electronically Signed   By: Burman Nieves M.D.   On: 01/07/2019 01:49     A/P 33 yo POD #2 s/p D&E for septic abortion - patient afebrile overnight - Development of a worsening cough with oxygen requirement - Patient being treated for CAP - Pulmonary consult obtained who will transfer the patient to [facility]  - No further obstetrical interventions needed - Patient is scheduled to follow up with me in the office on 01/17/2019

## 2019-01-07 NOTE — Discharge Summary (Signed)
OB Discharge Summary     Patient Name: Mary Donaldson DOB: 23-Jan-1986 MRN: [id number]  Date of admission: 01/04/2019 Delivering MD: This patient has no babies on file.  Date of discharge: 01/07/2019  Admitting diagnosis: Pain miscarriage  Intrauterine pregnancy: Unknown     Secondary diagnosis:  Active Problems:   Endometritis  Additional problems: Acute lung injury/ CAP     Discharge diagnosis: first trimester miscarriage with retained POC and endometritis. Patient transferred to [facility] due to Acute lung injury of unclear etiology                                   Hospital course:  Patient admitted with retained products of conception and fever. Patient underwent an uncomplicated dilatation and evacuation on 01/05/19. She was observed post op with plans for discharge once afebrile for 24 hours. During the evening of 01/06/19, patient reported a worsening cough with oxygen desaturation to 87% on room air. Chest X-ray demonstrated bilateral hilar and basilar infiltrates. Pulmonary consult was obtained and it was in the patient's best interest to be transferred to [facility] (Dr. Delton Coombes is the accepting physician). From an obstetrical stand point, in the absence of a decline in her respiratory status, patient was cleared for discharge. She is scheduled to follow up on 01/17/19 with Dr. [name]. Patient is expected to have light vaginal bleeding. Occasional cramping pain is expected and should be managed with ibuprofen  Physical exam  Vitals:   01/07/19 0800 01/07/19 0802 01/07/19 0948 01/07/19 1204  BP:  127/86  107/64  Pulse:  (!) 129  (!) 124  Resp:  (!) 22  20  Temp:  100.3 F (37.9 C)  98.8 F (37.1 C)  TempSrc:  Oral  Oral  SpO2: 92% 92% 98% 97%  Weight:      Height:        Labs: Lab Results  Component Value Date   WBC 16.8 (H) 01/07/2019   HGB 8.7 (L) 01/07/2019   HCT 27.3 (L) 01/07/2019   MCV 57.5 (L) 01/07/2019   PLT 308 01/07/2019   CMP Latest Ref Rng & Units 01/07/2019   Glucose 70 - 99 mg/dL 782(N)  BUN 6 - 20 mg/dL 6  Creatinine 5.62 - 1.30 mg/dL 8.65  Sodium 784 - 696 mmol/L 132(L)  Potassium 3.5 - 5.1 mmol/L 3.4(L)  Chloride 98 - 111 mmol/L 108  CO2 22 - 32 mmol/L 16(L)  Calcium 8.9 - 10.3 mg/dL 7.9(L)  Total Protein 6.5 - 8.1 g/dL -  Total Bilirubin 0.3 - 1.2 mg/dL -  Alkaline Phos 38 - 295 U/L -  AST 15 - 41 U/L -  ALT 0 - 44 U/L -    Discharge instruction: per After Visit Summary and "Baby and Me Booklet".  After visit meds:    Diet: routine diet  Activity: Advance as tolerated. Pelvic rest for 6 weeks.   Outpatient follow up: as scheduled on 01/17/19. Patient does not need to keep her 01/10/19 nursing appointment and was informed of that during her hospital stay Follow up Appt: Future Appointments  Date Time Provider Department Center  01/10/2019  1:00 PM [facility] None  01/17/2019  3:45 PM Lainee Lehrman, MD [facility] None   Follow up Visit:No follow-ups on file.   01/07/2019 Catalina Antigua, MD

## 2019-01-07 NOTE — Progress Notes (Signed)
Dr. Earlene Plateravis called about x-ray results. No new orders at the time

## 2019-01-07 NOTE — Progress Notes (Signed)
Telemetry notifed RN, pt had a run of PSVT HR 160's.  Patient is asymptomatic.  Notified critical care.

## 2019-01-07 NOTE — Progress Notes (Signed)
Patient unable to tolerate CT angio due to coughing spells when laying flat.  Pulmonary contacted. Case reviewed and agrees with treatment for CAP. He also suggested treatment for possible pulmonary edema with lasix

## 2019-01-08 ENCOUNTER — Inpatient Hospital Stay (HOSPITAL_COMMUNITY): Payer: Medicaid Other

## 2019-01-08 ENCOUNTER — Other Ambulatory Visit (HOSPITAL_COMMUNITY): Payer: Medicaid Other

## 2019-01-08 DIAGNOSIS — J80 Acute respiratory distress syndrome: Secondary | ICD-10-CM

## 2019-01-08 DIAGNOSIS — J9601 Acute respiratory failure with hypoxia: Secondary | ICD-10-CM

## 2019-01-08 LAB — RESPIRATORY PANEL BY PCR
Adenovirus: NOT DETECTED
Bordetella pertussis: NOT DETECTED
Chlamydophila pneumoniae: NOT DETECTED
Coronavirus 229E: NOT DETECTED
Coronavirus HKU1: NOT DETECTED
Coronavirus NL63: NOT DETECTED
Coronavirus OC43: NOT DETECTED
Influenza A: NOT DETECTED
Influenza B: NOT DETECTED
Metapneumovirus: NOT DETECTED
Mycoplasma pneumoniae: NOT DETECTED
PARAINFLUENZA VIRUS 3-RVPPCR: NOT DETECTED
PARAINFLUENZA VIRUS 4-RVPPCR: NOT DETECTED
Parainfluenza Virus 1: NOT DETECTED
Parainfluenza Virus 2: NOT DETECTED
RHINOVIRUS / ENTEROVIRUS - RVPPCR: NOT DETECTED
Respiratory Syncytial Virus: NOT DETECTED

## 2019-01-08 LAB — COMPREHENSIVE METABOLIC PANEL
ALT: 53 U/L — ABNORMAL HIGH (ref 0–44)
AST: 27 U/L (ref 15–41)
Albumin: 2.6 g/dL — ABNORMAL LOW (ref 3.5–5.0)
Alkaline Phosphatase: 118 U/L (ref 38–126)
Anion gap: 12 (ref 5–15)
BUN: <5 mg/dL — ABNORMAL LOW (ref 6–20)
CO2: 18 mmol/L — ABNORMAL LOW (ref 22–32)
CREATININE: 0.6 mg/dL (ref 0.44–1.00)
Calcium: 9.1 mg/dL (ref 8.9–10.3)
Chloride: 109 mmol/L (ref 98–111)
GFR calc Af Amer: >60 mL/min (ref >60)
GLUCOSE: 89 mg/dL (ref 70–99)
Potassium: 4 mmol/L (ref 3.5–5.1)
Sodium: 139 mmol/L (ref 135–145)
Total Bilirubin: 0.4 mg/dL (ref 0.3–1.2)
Total Protein: 6.4 g/dL — ABNORMAL LOW (ref 6.5–8.1)

## 2019-01-08 LAB — CBC WITH DIFFERENTIAL/PLATELET
Abs Immature Granulocytes: 0 10*3/uL (ref 0.00–0.07)
Basophils Absolute: 0.3 10*3/uL — ABNORMAL HIGH (ref 0.0–0.1)
Basophils Relative: 2 %
Eosinophils Absolute: 0.6 10*3/uL — ABNORMAL HIGH (ref 0.0–0.5)
Eosinophils Relative: 4 %
HCT: 30.1 % — ABNORMAL LOW (ref 36.0–46.0)
Hemoglobin: 9.3 g/dL — ABNORMAL LOW (ref 12.0–15.0)
LYMPHS ABS: 3.4 10*3/uL (ref 0.7–4.0)
Lymphocytes Relative: 24 %
MCH: 17.8 pg — ABNORMAL LOW (ref 26.0–34.0)
MCHC: 30.9 g/dL (ref 30.0–36.0)
MCV: 57.7 fL — ABNORMAL LOW (ref 80.0–100.0)
Monocytes Absolute: 0.6 10*3/uL (ref 0.1–1.0)
Monocytes Relative: 4 %
Neutro Abs: 9.3 10*3/uL — ABNORMAL HIGH (ref 1.7–7.7)
Neutrophils Relative %: 66 %
Platelets: 336 10*3/uL (ref 150–400)
RBC: 5.22 MIL/uL — ABNORMAL HIGH (ref 3.87–5.11)
RDW: 19.1 % — ABNORMAL HIGH (ref 11.5–15.5)
WBC: 14.1 10*3/uL — ABNORMAL HIGH (ref 4.0–10.5)
nRBC: 0 % (ref 0.0–0.2)
nRBC: 1 /100 WBC — ABNORMAL HIGH

## 2019-01-08 LAB — PROCALCITONIN: Procalcitonin: 0.5 ng/mL

## 2019-01-08 MED ORDER — IOPAMIDOL (ISOVUE-370) INJECTION 76%
INTRAVENOUS | Status: AC
  Filled 2019-01-08: qty 100

## 2019-01-08 MED ORDER — SODIUM CHLORIDE 0.9% FLUSH
10.0000 mL | Freq: Two times a day (BID) | INTRAVENOUS | Status: DC
  Administered 2019-01-08: 10 mL

## 2019-01-08 MED ORDER — IOPAMIDOL (ISOVUE-370) INJECTION 76%
75.0000 mL | Freq: Once | INTRAVENOUS | Status: AC | PRN
  Administered 2019-01-08: 75 mL via INTRAVENOUS

## 2019-01-08 MED ORDER — SODIUM CHLORIDE 0.9% FLUSH: 10.0000 mL | INTRAVENOUS | Status: DC | PRN

## 2019-01-08 NOTE — Progress Notes (Signed)
Mary Donaldson, MRN:  [id number], DOB:  25-Aug-1986, LOS: 4 ADMISSION DATE:  01/04/2019, CONSULTATION DATE:  1/31 REFERRING MD:  Gigi Gin, CHIEF COMPLAINT:  Cough ? CAP   Brief History   33 year old female admitted 1/28 1 day following spontaneous abortion with abdominal sepsis in setting of endometriosis and possible URI.  She underwent dilation and evacuation on 1/29, pulmonary symptoms worsened over the following days with ongoing cough and dyspnea as well as progressive hypoxia pulmonary asked to see on 1/31 for hypoxia and chest x-ray with diffuse bilateral airspace disease -Traveled to [place] in November  History of present illness    33 year old female patient who who presented on 1/28 1 day after spontaneous 12-week abortion with fever of 100.3, chills, body ache fatigue and abdominal discomfort.  She was admitted with working diagnosis of abdominal sepsis following spontaneous abortion resulting in endometriosis, as well as URI.  Was started on empiric antibiotics, IV hydration was initiated, cultures obtained, and ultimately she went to the OR for dilation and evacuation on 1/29.  Her postoperative course has been complicated by ongoing fever and chills as well as nonproductive cough on 1/30 she was complaining of continued nonproductive cough worsening shortness of breath.  Pulse oximetry recorded saturations as low as 87% requiring supplemental oxygen to bring above 90.  A chest x-ray was obtained on 1/31 this demonstrated diffuse bilateral pulmonary infiltrates.  Pulmonary was asked to see due to concern for worsening respiratory failure  Past Medical History  HTN  Significant Hospital Events   1/28 : Admitted with working diagnosis of URI, fever, and abdominal sepsis secondary to endometriosis following spontaneous abortion; cultures obtained antibiotics initiated.  Apparently had been negative for flu swab the day prior 1/29 went to the operating room for dilation and evacuation.   Intubated via LMA  Consults:  Pulmonary consult 1/31  Procedures:  1/29 dilation and evacuation  Significant Diagnostic Tests:    Micro Data:  Urine culture 1/28 multiple species, lab suggesting recollection Blood culture 1/28 pending u strep 1/31>>> u legionella 1/31>>> RVP 1/31>>>  Antimicrobials:  azithromycin 1/31 Augmentin 1/31 Clindamycin 1/28 to 1/30 Gentamicin 1/28 to 1/30  Interim history/subjective:  Dyspnea is less. Cough is decreased some  O2 sats improved. O2 demands less .  O2 sats 97% on room air  Complains of bilateral breast pain , very tight and hard to touch . Very painful and sore. No redness, drainage or nipple discharge.    Objective   Blood pressure 125/80, pulse (!) 102, temperature 98 F (36.7 C), temperature source Oral, resp. rate (!) 36, height 5\' 3"  (1.6 m), weight 72.8 kg, SpO2 97 %, unknown if currently breastfeeding.        Intake/Output Summary (Last 24 hours) at 01/08/2019 1356 Last data filed at 01/07/2019 1700 Gross per 24 hour  Intake 125 ml  Output 1000 ml  Net -875 ml   Filed Weights   01/04/19 1619 01/04/19 2135  Weight: 72.8 kg 72.8 kg    Examination: General: Adult female in no acute distress  HENT: NCAT, no JVD  Lungs: Clear to auscultation faint bibasilar rales no increased work of breathing or accessory muscle use  Cardiovascular:  ST , no m/r/g  Breast : bilateral enlarged breast , very firm/hard, very sore to touch  Abdomen: Soft nontender positive bowel sounds   Extremities: No edema Neuro: Oriented x3 no focal deficits GU: Voiding  Resolved Hospital Problem list     Assessment & Plan:  Acute hypoxic respiratory failure in the setting of diffuse pulmonary infiltrates -Differential diagnosis includes: CAP viral and/or bacterial with evolving acute lung injury.  Had negative flu swab reportedly prior to admission.  Aspiration pneumonitis, pulmonary edema or negative pressure pulmonary edema; although both of  these seem a little less likely given cough and respiratory symptoms precluded hospital admission.  I do not think this is pulmonary emboli given we have excellent explanation of her hypoxia radiographically  Plan  urinary strep and Legionella antigen pending  respiratory viral panel pending   echo pending .  Continue ABX w/ azithromycin and Unasyn  Wean oxygen for saturations greater than 90% Incentive spirometry CT chest now   Ongoing SIRS/sepsis -Initially from abdominal source, now concerned also about secondary pulmonary infection either from community-acquired pneumonia or aspiration event Plan  Follow-up CBC Cont IV abx   Leukocytosis in setting of sepsis Plan Continue to trend CBC  Fluid and electrolyte imbalance: hyponatremia and hypokalemia Plan Cont NS Replace K   Blood loss anemia Plan Trend CBC  Breast engorgement Ottis Stain ? Etiology (doubt engorgement as was in 1st trimester) . Spoke to [facility] Dr. [name] , treat with warm compresses and chest binder for comfort  CT chest pending .     Best practice:  Diet: regular  Pain/Anxiety/Delirium protocol (if indicated): NA VAP protocol (if indicated): NA DVT prophylaxis: scd GI prophylaxis: NA Glucose control: NA Mobility: OOB Code Status: full code  Family Communication: NA Disposition:   Labs   CBC: Recent Labs  Lab 01/04/19 1654 01/05/19 0521 01/06/19 0837 01/07/19 1139  WBC 16.2* 15.2* 12.9* 16.8*  NEUTROABS 14.2* 13.1*  --   --   HGB 9.7* 8.5* 8.3* 8.7*  HCT 30.4* 27.0* 25.6* 27.3*  MCV 59.3* 59.1* 58.2* 57.5*  PLT 295 267 252 308    Basic Metabolic Panel: Recent Labs  Lab 01/04/19 1654 01/07/19 1139  NA 128* 132*  K 3.4* 3.4*  CL 102 108  CO2 18* 16*  GLUCOSE 120* 162*  BUN 6 6  CREATININE 0.54 0.65  CALCIUM 8.2* 7.9*   GFR: Estimated Creatinine Clearance: 98.4 mL/min (by C-G formula based on SCr of 0.65 mg/dL). Recent Labs  Lab 01/04/19 1654 01/04/19 1750 01/04/19 1959  01/05/19 0521 01/06/19 0837 01/07/19 1139 01/07/19 1707 01/08/19 0313  PROCALCITON  --   --   --   --   --   --  0.53 0.50  WBC 16.2*  --   --  15.2* 12.9* 16.8*  --   --   LATICACIDVEN  --  1.2 0.6  --   --   --  1.3  --     Liver Function Tests: Recent Labs  Lab 01/04/19 1654  AST 32  ALT 28  ALKPHOS 85  BILITOT 0.3  PROT 6.5  ALBUMIN 3.1*   No results for input(s): LIPASE, AMYLASE in the last 168 hours. No results for input(s): AMMONIA in the last 168 hours.  ABG No results found for: PHART, PCO2ART, PO2ART, HCO3, TCO2, ACIDBASEDEF, O2SAT   Coagulation Profile: No results for input(s): INR, PROTIME in the last 168 hours.  Cardiac Enzymes: No results for input(s): CKTOTAL, CKMB, CKMBINDEX, TROPONINI in the last 168 hours.  HbA1C: No results found for: HGBA1C  CBG: No results for input(s): GLUCAP in the last 168 hours.   Past Medical History  She,  has a past medical history of Hypertension.   Surgical History    Past Surgical History:  Procedure Laterality Date  .  DILATION AND EVACUATION N/A 01/05/2019   Procedure: DILATATION AND EVACUATION;  Surgeon: Catalina Antigua, MD;  Location: [facility];  Service: Gynecology;  Laterality: N/A;     Social History   reports that she has never smoked. She has never used smokeless tobacco. She reports that she does not drink alcohol or use drugs.   Family History   Her family history includes Diabetes in her mother; Hypertension in her mother.   Allergies No Known Allergies   Home Medications  Prior to Admission medications   Medication Sig Start Date End Date Taking? Authorizing Provider  cyclobenzaprine (FLEXERIL) 5 MG tablet Take 1 tablet (5 mg total) by mouth 3 (three) times daily as needed for muscle spasms. 01/03/19  Yes Arvilla Market, DO  labetalol (NORMODYNE) 100 MG tablet Take 1 tablet (100 mg total) by mouth 2 (two) times daily. 12/27/18  Yes Constant, Peggy, MD  Prenatal Vit-Fe Fumarate-FA  (MULTIVITAMIN-PRENATAL) 27-0.8 MG TABS tablet Take 1 tablet by mouth daily at 12 noon. 12/27/18  Yes Constant, Peggy, MD  progesterone (PROMETRIUM) 200 MG capsule Take 1 capsule (200 mg total) by mouth daily. 12/27/18   Constant, Gigi Gin, MD     Critical care time:        Andreu Drudge NP-C  [facility]  [phone]  01/08/2019

## 2019-01-08 NOTE — Progress Notes (Signed)
  Echocardiogram 2D Echocardiogram has been performed.  Delcie Roch 01/08/2019, 6:04 PM

## 2019-01-09 ENCOUNTER — Inpatient Hospital Stay (HOSPITAL_COMMUNITY): Payer: Medicaid Other

## 2019-01-09 DIAGNOSIS — R000 Tachycardia, unspecified: Secondary | ICD-10-CM

## 2019-01-09 LAB — ECHOCARDIOGRAM COMPLETE
Height: 63 in
Weight: 2567.92 oz

## 2019-01-09 LAB — CULTURE, BLOOD (ROUTINE X 2)
Culture: NO GROWTH
Culture: NO GROWTH
Special Requests: ADEQUATE
Special Requests: ADEQUATE

## 2019-01-09 LAB — BASIC METABOLIC PANEL
Anion gap: 10 (ref 5–15)
BUN: 8 mg/dL (ref 6–20)
CALCIUM: 9 mg/dL (ref 8.9–10.3)
CO2: 19 mmol/L — ABNORMAL LOW (ref 22–32)
Chloride: 109 mmol/L (ref 98–111)
Creatinine, Ser: 0.66 mg/dL (ref 0.44–1.00)
GFR calc non Af Amer: >60 mL/min (ref >60)
Glucose, Bld: 125 mg/dL — ABNORMAL HIGH (ref 70–99)
Potassium: 3.5 mmol/L (ref 3.5–5.1)
Sodium: 138 mmol/L (ref 135–145)

## 2019-01-09 LAB — LEGIONELLA PNEUMOPHILA SEROGP 1 UR AG: L. PNEUMOPHILA SEROGP 1 UR AG: NEGATIVE

## 2019-01-09 LAB — CBC
HCT: 28.7 % — ABNORMAL LOW (ref 36.0–46.0)
Hemoglobin: 8.8 g/dL — ABNORMAL LOW (ref 12.0–15.0)
MCH: 17.6 pg — ABNORMAL LOW (ref 26.0–34.0)
MCHC: 30.7 g/dL (ref 30.0–36.0)
MCV: 57.4 fL — ABNORMAL LOW (ref 80.0–100.0)
NRBC: 0.3 % — AB (ref 0.0–0.2)
Platelets: 347 10*3/uL (ref 150–400)
RBC: 5 MIL/uL (ref 3.87–5.11)
RDW: 18.6 % — ABNORMAL HIGH (ref 11.5–15.5)
WBC: 14.9 10*3/uL — ABNORMAL HIGH (ref 4.0–10.5)

## 2019-01-09 LAB — PROCALCITONIN: Procalcitonin: 0.3 ng/mL

## 2019-01-09 MED ORDER — AZITHROMYCIN 250 MG PO TABS: 250.0000 mg | ORAL_TABLET | Freq: Every day | ORAL | 0 refills | Status: AC

## 2019-01-09 MED ORDER — AMOXICILLIN-POT CLAVULANATE 875-125 MG PO TABS: 1.0000 | ORAL_TABLET | Freq: Two times a day (BID) | ORAL | 0 refills | Status: AC

## 2019-01-09 NOTE — Discharge Summary (Addendum)
DISCHARGE SUMMARY  Mary Donaldson  MR#: [id number]  DOB:[birth year]  Date of Admission: 01/04/2019 Date of Discharge: 01/09/2019  Attending Physician:Patsie Mccardle Silvestre Gunner, MD  Patient's WUJ:[id number], No Pcp Per  Consults: PCCM  Disposition: D/C home   Follow-up Appts: Follow-up Information    Donaldson, Peggy, MD Follow up.   Specialty:  Obstetrics and Gynecology Why:  Keep your scheduled appointment w/ Dr. [name] on Feb 10.  Contact information: [address] [place] [address] [phone]           Tests Needing Follow-up: -Follow-up of TTE reading is required - clinically it is not felt likely that there will be acute findings therefore we are not delaying discharge due to this issue  Discharge Diagnoses: Acute hypoxic respiratory failure Diffuse acute lung injury Sepsis v/s SIRS due to above Hyponatremia Hypokalemia Acute blood loss anemia Idiopathic breast tenderness/engorgement  Initial presentation: 33 year old female patient who who presented to [facility] on 1/28 1 day after a spontaneous 12-week miscarriage with fever of 100.3, chills, body aches, fatigue, and abdominal discomfort. She was admitted to the GYN Service with a working diagnosis of abdominal sepsis following spontaneous abortion resulting in endometriosis, as well as URI. She was tx w/ empiric antibiotics and IV hydration, and ultimately went to the OR for dilation and evacuation on 1/29. Her postoperative course was complicated by ongoing fever and chills as well as a nonproductive cough which developed on 1/30. She complained of worsening shortness of breath.  Pulse oximetry recorded saturations as low as 87% requiring supplemental oxygen. A chest x-ray was 1/31 demonstrated diffuse bilateral pulmonary infiltrates.  PCCM was asked to see due to concern for worsening respiratory failure, and she was transferred to [facility] for further care. The etiology of her resp distress was unclear,  but thought to be consistent with an acute lung injury, aspiration pneumonitis, or an infectious process.   Hospital Course: The patient was transferred to the acute units at [facility] from the [facility] per the [facility] team on 1/31. She initially required supportive oxygen therapy.  She was empirically treated with Unasyn and azithromycin.  A CT angiogram was accomplished on 2/1 which noted no evidence of pulmonary embolus and revealed patchy alveolar groundglass opacities bilaterally suggestive of possible edema as well as small bilateral pleural effusions. There were no appreciable musculoskeletal findings on this exam.  A TTE was also accomplished but the results were not yet available at the time of the patient's discharge.  With empiric antibiotic therapy and supportive care the patient rapidly improved. A respiratory virus panel was entirely negative as was a strep pneumonia urinary screen.  The patient's procalcitonin trended downward in an encouraging fashion.  By 2/2 she was very anxious to be discharged home as soon as possible.  She was on room air with saturations in the upper 90s.  She denied chest pain nausea vomiting or abdominal pain.  The patient was also noted to developed tenderness and firmness of the breast bilaterally on 2/1.  Exam revealed no clear etiology.  There was no evidence of acute infection.  CT of the chest did not reveal any concerning findings from a musculoskeletal standpoint.  PCCM discussed this finding with GYN who suggested warm compresses and a chest binder.  At the time of her discharge on 2/2 the patient felt that the tenderness in her breast was improving but is not entirely resolved.  There were no new symptoms otherwise.  The patient is to be discharged home  as per her request.  She is felt to be medically stable for same.  She is advised to follow-up within the next week with her GYN for evaluation of her breast tenderness as well as routine follow-up of  her recent D&C.  She is advised to complete a full course of antibiotic treatment as has been prescribed at her discharge.  She is advised to return to the emergency room immediately should she develop recurrent severe shortness of breath chest pain or severe abdominal pain or bleeding.  She is advised that follow-up of her transthoracic echocardiogram will be necessary as an outpatient.   Allergies as of 01/09/2019   No Known Allergies     Medication List    STOP taking these medications   labetalol 100 MG tablet Commonly known as:  NORMODYNE   progesterone 200 MG capsule Commonly known as:  PROMETRIUM     TAKE these medications   amoxicillin-clavulanate 875-125 MG tablet Commonly known as:  AUGMENTIN Take 1 tablet by mouth 2 (two) times daily for 5 days.   azithromycin 250 MG tablet Commonly known as:  ZITHROMAX Take 1 tablet (250 mg total) by mouth daily for 2 days. Start taking on:  January 10, 2019   cyclobenzaprine 5 MG tablet Commonly known as:  FLEXERIL Take 1 tablet (5 mg total) by mouth 3 (three) times daily as needed for muscle spasms.   multivitamin-prenatal 27-0.8 MG Tabs tablet Take 1 tablet by mouth daily at 12 noon.       Day of Discharge BP 117/80 (BP Location: Left Arm)   Pulse (!) 102   Temp 97.8 F (36.6 C) (Oral)   Resp (!) 30   Ht 5\' 3"  (1.6 m)   Wt 72.8 kg   SpO2 96%   Breastfeeding Unknown   BMI 28.43 kg/m   Physical Exam: General: No acute respiratory distress Lungs: Clear to auscultation bilaterally without wheezes or crackles Cardiovascular: Regular rate and rhythm without murmur gallop or rub normal S1 and S2 Abdomen: Nontender, nondistended, soft, bowel sounds positive, no rebound, no ascites, no appreciable mass Extremities: No significant cyanosis, clubbing, or edema bilateral lower extremities  Basic Metabolic Panel: Recent Labs  Lab 01/04/19 1654 01/07/19 1139 01/08/19 1502 01/09/19 0247  NA 128* 132* 139 138  K 3.4* 3.4*  4.0 3.5  CL 102 108 109 109  CO2 18* 16* 18* 19*  GLUCOSE 120* 162* 89 125*  BUN 6 6 <5* 8  CREATININE 0.54 0.65 0.60 0.66  CALCIUM 8.2* 7.9* 9.1 9.0    Liver Function Tests: Recent Labs  Lab 01/04/19 1654 01/08/19 1502  AST 32 27  ALT 28 53*  ALKPHOS 85 118  BILITOT 0.3 0.4  PROT 6.5 6.4*  ALBUMIN 3.1* 2.6*    CBC: Recent Labs  Lab 01/04/19 1654 01/05/19 0521 01/06/19 0837 01/07/19 1139 01/08/19 1502 01/09/19 0247  WBC 16.2* 15.2* 12.9* 16.8* 14.1* 14.9*  NEUTROABS 14.2* 13.1*  --   --  9.3*  --   HGB 9.7* 8.5* 8.3* 8.7* 9.3* 8.8*  HCT 30.4* 27.0* 25.6* 27.3* 30.1* 28.7*  MCV 59.3* 59.1* 58.2* 57.5* 57.7* 57.4*  PLT 295 267 252 308 336 347     Recent Results (from the past 240 hour(s))  Wet prep, genital     Status: Abnormal   Collection Time: 01/01/19  5:55 AM  Result Value Ref Range Status   Yeast Wet Prep HPF POC NONE SEEN NONE SEEN Final   Trich, Wet Prep NONE SEEN  NONE SEEN Final   Clue Cells Wet Prep HPF POC PRESENT (A) NONE SEEN Final   WBC, Wet Prep HPF POC MANY (A) NONE SEEN Final   Sperm NONE SEEN  Final    Comment: Performed at [facility], [address] [place], [place] [address]  Wet prep, genital     Status: Abnormal   Collection Time: 01/03/19 12:25 PM  Result Value Ref Range Status   Yeast Wet Prep HPF POC NONE SEEN NONE SEEN Final   Trich, Wet Prep NONE SEEN NONE SEEN Final   Clue Cells Wet Prep HPF POC NONE SEEN NONE SEEN Final   WBC, Wet Prep HPF POC FEW (A) NONE SEEN Final    Comment: MODERATE BACTERIA SEEN   Sperm NONE SEEN  Final    Comment: Performed at [facility], [address], [place], [place] [address]  Urine Culture     Status: Abnormal   Collection Time: 01/04/19  4:48 PM  Result Value Ref Range Status   Specimen Description   Final    URINE, CLEAN CATCH Performed at [facility], [address], [place], [place] [address]    Special Requests   Final    Normal Performed at [facility], [address] [place], [place] [address]    Culture MULTIPLE SPECIES PRESENT, SUGGEST RECOLLECTION (A)  Final   Report Status 01/06/2019 FINAL  Final  Blood Culture (routine x 2)     Status: None   Collection Time: 01/04/19  5:51 PM  Result Value Ref Range Status   Specimen Description BLOOD LEFT ARM  Final   Special Requests   Final    BOTTLES DRAWN AEROBIC AND ANAEROBIC Blood Culture adequate volume Performed at [facility], [address] [place], [place] [address]    Culture NO GROWTH 5 DAYS  Final   Report Status 01/09/2019 FINAL  Final  Blood Culture (routine x 2)     Status: None   Collection Time: 01/04/19  6:00 PM  Result Value Ref Range Status   Specimen Description BLOOD LEFT HAND  Final   Special Requests   Final    BOTTLES DRAWN AEROBIC AND ANAEROBIC Blood Culture adequate volume Performed at [facility], [address], [place], [place] [address]    Culture NO GROWTH 5 DAYS  Final   Report Status 01/09/2019 FINAL  Final  Respiratory Panel by PCR     Status: None   Collection Time: 01/07/19  4:57 PM  Result Value Ref Range Status   Adenovirus NOT DETECTED NOT DETECTED Final   Coronavirus 229E NOT DETECTED NOT DETECTED Final   Coronavirus HKU1 NOT DETECTED NOT DETECTED Final   Coronavirus NL63 NOT DETECTED NOT DETECTED Final   Coronavirus OC43 NOT DETECTED NOT DETECTED Final   Metapneumovirus NOT DETECTED NOT DETECTED Final   Rhinovirus / Enterovirus NOT DETECTED NOT DETECTED Final   Influenza A NOT DETECTED NOT DETECTED Final   Influenza B NOT DETECTED NOT DETECTED Final   Parainfluenza Virus 1 NOT DETECTED NOT DETECTED Final   Parainfluenza Virus 2 NOT DETECTED NOT DETECTED Final   Parainfluenza Virus 3 NOT DETECTED NOT DETECTED Final   Parainfluenza Virus 4 NOT DETECTED NOT DETECTED Final   Respiratory Syncytial Virus NOT DETECTED NOT DETECTED Final   Bordetella pertussis NOT DETECTED NOT DETECTED Final   Chlamydophila pneumoniae NOT DETECTED NOT  DETECTED Final   Mycoplasma pneumoniae NOT DETECTED NOT DETECTED Final     Time spent in discharge (  includes decision making & examination of pt): 35 minutes  01/09/2019, 11:15 AM   Lonia Blood, MD [facility] Office  [phone]

## 2019-01-09 NOTE — Discharge Instructions (Signed)
Acute Respiratory Distress Syndrome, Adult ° °Acute respiratory distress syndrome is a life-threatening condition in which fluid collects in the lungs. This prevents the lungs from filling with air and passing oxygen into the blood. This can cause the lungs and other vital organs to fail. The condition usually develops following an infection, illness, surgery, or injury. °What are the causes? °This condition may be caused by: °· An infection, such as sepsis or pneumonia. °· A serious injury to the head or chest. °· Severe bleeding from an injury. °· A major surgery. °· Breathing in harmful chemicals or smoke. °· Blood transfusions. °· A blood clot in the lungs. °· Breathing in vomit (aspiration). °· Near-drowning. °· Inflammation of the pancreas (pancreatitis). °· A drug overdose. °What are the signs or symptoms? °Sudden shortness of breath and rapid breathing are the main symptoms of this condition. Other symptoms may include: °· A fast or irregular heartbeat. °· Skin, lips, or fingernails that look blue (cyanosis). °· Confusion. °· Tiredness or loss of energy. °· Chest pain, particularly while taking a breath. °· Coughing. °· Restlessness or anxiety. °· Fever. This is usually present if there is an underlying infection, such as pneumonia. °How is this diagnosed? °This condition is diagnosed based on: °· Your symptoms. °· Medical history. °· A physical exam. During the exam, your health care provider will listen to your heart and check for crackling or wheezing sounds in your lungs. °You may also have other tests to confirm the diagnosis and measure how well your lungs are working. These may include: °· Measuring the amount of oxygen in your blood. Your health care provider will use two methods to do this procedure: °? A small device (pulse oximeter) that is placed on your finger, earlobe, or toe. °? An arterial blood gas test. A sample of blood is taken from an artery and tested for oxygen levels. °· Blood  tests. °· Chest X-rays or CT scans to look for fluid in the lungs. °· Taking a sample of your sputum to test for infection. °· Heart test, such as an echocardiogram or electrocardiogram. This is done to rule out any heart problems (such as heart failure) that may be causing your symptoms. °· Bronchoscopy. During this test, a thin, flexible tube with a light is passed into the mouth or nose, down the windpipe, and into the lungs. °How is this treated? °Treatment depends on the cause of your condition. The goal is to support you while your lungs heal and the underlying cause is treated. Treatment may include: °· Oxygen therapy. This may be done through: °? A tube in your nose or a face mask. °? A ventilator. This device helps move air into and out of your lungs through a breathing tube that is inserted into your mouth or nose. °· Continuous positive airway pressure (CPAP). This treatment uses mild air pressure to keep the airways open. A mask or other device will be placed over your nose or mouth. °· Tracheostomy. During this procedure, a small cut is made in your neck to create an opening to your windpipe. A breathing tube is placed directly into your windpipe. The breathing tube is connected to a ventilator. This is done if you have problems with your airway or if you need a ventilator for a long period of time. °· Positioning you to lie on your stomach (prone position). °· Medicines, such as: °? Sedatives to help you relax. °? Blood pressure medicines. °? Antibiotics to treat infection. °? Blood   thinners to prevent blood clots. ? Diuretics to help prevent excess fluid.  Fluids and nutrients given through an IV tube.  Wearing compression stockings on your legs to prevent blood clots.  Extra corporeal membrane oxygenation (ECMO). This treatment takes blood outside your body, adds oxygen, and removes carbon dioxide. The blood is then returned to your body. This treatment is only used in severe cases. Follow  these instructions at home:  Take over-the-counter and prescription medicines only as told by your health care provider.  Do not use any products that contain nicotine or tobacco, such as cigarettes and e-cigarettes. If you need help quitting, ask your health care provider.  Limit alcohol intake to no more than 1 drink per day for nonpregnant women and 2 drinks per day for men. One drink equals 12 oz of beer, 5 oz of wine, or 1 oz of hard liquor.  Ask friends and family to help you if daily activities make you tired.  Attend any pulmonary rehabilitation as told by your health care provider. This may include: ? Education about your condition. ? Exercises. ? Breathing training. ? Counseling. ? Learning techniques to conserve energy. ? Nutrition counseling.  Keep all follow-up visits as told by your health care provider. This is important. Contact a health care provider if:  You become short of breath during activity or while resting.  You develop a cough that does not go away.  You have a fever.  Your symptoms do not get better or they get worse.  You become anxious or depressed. Get help right away if:  You have sudden shortness of breath.  You develop sudden chest pain that does not go away.  You develop a rapid heart rate.  You develop swelling or pain in one of your legs.  You cough up blood.  You have trouble breathing.  Your skin, lips, or fingernails turn blue. These symptoms may represent a serious problem that is an emergency. Do not wait to see if the symptoms will go away. Get medical help right away. Call your local emergency services (911 in the [place] Do not drive yourself to the hospital. Summary  Acute respiratory distress syndrome is a life-threatening condition in which fluid collects in the lungs, which leads the lungs and other vital organs to fail.  This condition usually develops following an infection, illness, surgery, or injury.  Sudden  shortness of breath and rapid breathing are the main symptoms of acute respiratory distress syndrome.  Treatment may include oxygen therapy, continuous positive airway pressure (CPAP), tracheostomy, lying on your stomach (prone position), medicines, fluids and nutrients given through an IV tube, compression stockings, and extra corporeal membrane oxygenation (ECMO). This information is not intended to replace advice given to you by your health care provider. Make sure you discuss any questions you have with your health care provider. Document Released: 11/24/2005 Document Revised: 11/10/2016 Document Reviewed: 11/10/2016 Elsevier Interactive Patient Education  2019 [employer]    Endometritis   Endometritis is irritation, soreness, or inflammation that affects the lining of the uterus (endometrium). Infection is usually the cause of endometritis. It is important to get treatment to prevent complications. Common complications may include more severe infections and not being able to have children(infertility). What are the causes? This condition may be caused by:  Bacterial infections.  STIs (sexually transmitted infections).  A miscarriage or childbirth, especially after a long labor or cesarean delivery.  Certain gynecological procedures. These may include dilation and curettage (D&C), hysteroscopy, or  birth control (contraceptive) insertion.  Tuberculosis (TB). What are the signs or symptoms? Symptoms of this condition include:  Fever.  Lower abdomen (abdominal) pain.  Pelvis (pelvic) pain.  Abnormal vaginal discharge or bleeding.  Abdominal bloating (distention) or swelling.  General discomfort or generally feeling ill.  Discomfort with bowel movements.  Constipation. How is this diagnosed? This condition may be diagnosed based on:  A physical exam, including a pelvic exam.  Tests, such as: ? Blood tests. ? Removal of a sample of endometrial tissue for testing  (endometrial biopsy). ? Examining a sample of vaginal discharge under a microscope (wet prep). ? Removal of a sample of fluid from the cervix for testing (cervical culture). ? Surgical examination of the pelvis and abdomen. How is this treated? This condition is treated with:  Antibiotic medicines.  For more severe cases, hospitalization may be needed to give fluids and antibiotics directly into a vein through an IV tube. Follow these instructions at home:  Take over-the-counter and prescription medicines only as told by your health care provider.  Drink enough fluid to keep your urine clear or pale yellow.  Take your antibiotic medicine as told by your health care provider. Do not stop taking the antibiotic even if you start to feel better.  Do not douche or have sex (including vaginal, oral, and anal sex) until your health care provider approves.  If your endometritis was caused by an STI, do not have sex (including vaginal, oral, and anal sex) until your partner has also been treated for the STI.  Return to your normal activities as told by your health care provider. Ask your health care provider what activities are safe for you.  Keep all follow-up visits as told by your health care provider. This is important. Contact a health care provider if:  You have pain that does not get better with medicine.  You have a fever.  You have pain with bowel movements. Get help right away if:  You have abdominal swelling.  You have abdominal pain that gets worse.  You have bad-smelling vaginal discharge, or an increased amount of vaginal discharge.  You have abnormal vaginal bleeding.  You have nausea and vomiting. Summary  Endometritis affects the lining of the uterus (endometrium) and is usually caused by an infection.  It is important to get treatment to prevent complications.  You have several treatment options for endometritis. Treatment may include antibiotics and IV  fluids.  Take your antibiotic medicine as told by your health care provider. Do not stop taking the antibiotic even if you start to feel better.  Do not douche or have sex (including vaginal, oral, and anal sex) until your health care provider approves. This information is not intended to replace advice given to you by your health care provider. Make sure you discuss any questions you have with your health care provider. Document Released: 11/18/2001 Document Revised: 12/09/2016 Document Reviewed: 12/09/2016 Elsevier Interactive Patient Education  2019 [employer].

## 2019-01-10 ENCOUNTER — Other Ambulatory Visit: Payer: Medicaid Other

## 2019-01-17 ENCOUNTER — Encounter: Payer: Medicaid Other | Admitting: Obstetrics and Gynecology

## 2019-01-19 ENCOUNTER — Encounter: Payer: Self-pay | Admitting: Obstetrics and Gynecology

## 2019-01-24 ENCOUNTER — Encounter: Payer: Medicaid Other | Admitting: Obstetrics and Gynecology

## 2019-01-31 ENCOUNTER — Ambulatory Visit: Payer: Medicaid Other | Admitting: Obstetrics and Gynecology

## 2019-01-31 ENCOUNTER — Ambulatory Visit (INDEPENDENT_AMBULATORY_CARE_PROVIDER_SITE_OTHER): Payer: Medicaid Other | Admitting: Obstetrics and Gynecology

## 2019-01-31 VITALS — BP 135/89 | HR 92 | Wt 155.0 lb

## 2019-01-31 DIAGNOSIS — Z9889 Other specified postprocedural states: Secondary | ICD-10-CM

## 2019-02-01 ENCOUNTER — Encounter: Payer: Self-pay | Admitting: Obstetrics and Gynecology

## 2019-02-01 NOTE — Progress Notes (Signed)
Erroneous encounter

## 2019-02-01 NOTE — Progress Notes (Signed)
33 yo here for follow up following D&E for the treatment of septic abortion on 01/05/19. Patient reports feeling well since her hospital discharge. She reports persistent milk production and breast engorgement. She has been sexually active without complaints. She denies pelvic pain or abnormal discharge.   Past Medical History:  Diagnosis Date  . Hypertension    Past Surgical History:  Procedure Laterality Date  . DILATION AND EVACUATION N/A 01/05/2019   Procedure: DILATATION AND EVACUATION;  Surgeon: Catalina Antigua, MD;  Location: [facility];  Service: Gynecology;  Laterality: N/A;   Family History  Problem Relation Age of Onset  . Diabetes Mother   . Hypertension Mother    Social History   Tobacco Use  . Smoking status: Never Smoker  . Smokeless tobacco: Never Used  Substance Use Topics  . Alcohol use: Never    Frequency: Never  . Drug use: Never   ROS See pertinent in HPI  Blood pressure 135/89, pulse 92, weight 155 lb (70.3 kg), unknown if currently breastfeeding. GENERAL: Well-developed, well-nourished female in no acute distress.  ABDOMEN: Soft, nontender, nondistended. No organomegaly. EXTREMITIES: No cyanosis, clubbing, or edema, 2+ distal pulses.  A/P 33 yo here for post op check s/p D&E - Patient is medically cleared to resume all activities of daily living - Patient plans to conceive in the next few months - Patient is requesting pre-conception genetic counseling- Referral placed in epic - Continue prenatal vitamins - Continue labetalol - RTC prn

## 2019-02-08 ENCOUNTER — Other Ambulatory Visit: Payer: Self-pay

## 2019-02-08 DIAGNOSIS — O10919 Unspecified pre-existing hypertension complicating pregnancy, unspecified trimester: Secondary | ICD-10-CM

## 2019-02-08 DIAGNOSIS — O0990 Supervision of high risk pregnancy, unspecified, unspecified trimester: Secondary | ICD-10-CM

## 2019-02-08 NOTE — Progress Notes (Signed)
error 

## 2019-02-14 ENCOUNTER — Ambulatory Visit (HOSPITAL_COMMUNITY): Payer: Self-pay | Admitting: Obstetrics and Gynecology

## 2019-02-14 ENCOUNTER — Ambulatory Visit (HOSPITAL_COMMUNITY): Payer: Medicaid Other

## 2019-02-14 ENCOUNTER — Ambulatory Visit (HOSPITAL_COMMUNITY): Payer: Medicaid Other | Attending: Obstetrics and Gynecology

## 2019-02-14 DIAGNOSIS — N96 Recurrent pregnancy loss: Secondary | ICD-10-CM

## 2019-02-23 LAB — CHROMOSOME, BLOOD, ROUTINE
Cells Analyzed: 20
Cells Counted: 20
Cells Karyotyped: 2
GTG Band Resolution Achieved: 500

## 2019-02-23 LAB — HEMOGLOBINOPATHY EVALUATION
HEMOGLOBIN A2 QUANTITATION: 5 % — AB (ref 1.8–3.2)
HEMOGLOBIN F QUANTITATION: 0.7 % (ref 0.0–2.0)
HGB C: 0 %
HGB S: 0 %
HGB VARIANT: 0 %
Hgb A: 94.3 % — ABNORMAL LOW (ref 96.4–98.8)

## 2019-02-23 LAB — ALPHA-THALASSEMIA GENOTYPR

## 2019-12-09 NOTE — L&D Delivery Note (Addendum)
DELIVERY NOTE  After epidural placement, deep repetitive variables noted. Patient placed right then left lateral, pitocin held, IV fluid bolus given, phenylephrine administered. CE found taht patient quickly progressed from 6cm to 9cm to complete with urge to push. Epidural controlling pain. With pushing, slow descent, continued deep variable decels however patient felt to have good change of vaginal delivery. Risks and benefits of vacuum discussed in detail.  Risks include, but are not limited to the risks of anesthesia, bleeding, infection, damage to maternal tissues, fetal cephalhematoma.  There is also the risk of inability to effect vaginal delivery of the head, or shoulder dystocia that cannot be resolved by established maneuvers, leading to the need for emergency cesarean section. Pt understands and desires to move forward with operative delivery. Mity Vac applicator placed, 5 pulls with 3 pop-offs felt to be 2/2 fetal hair. Pt pushed and delivered a viable female infant in LOA position. Nuchal x2, easily reduced. Anterior and posterior shoulders spontaneously delivered with next two pushes; body easily followed next. Infant placed on mothers abdomen and bulb suction of mouth and nose performed. Cord was then clamped and cut by MD. Cord blood obtained, 3VC. Cord pH 7.17 arterial. Baby had a vigorous spontaneous cry noted. Umbilical cord noted to be thin and friable, avulsed with minimal traction. Manual evacuation performed, placenta removed intact. Fundal massage performed and pitocin per protocol. Fundus firm. The following lacerations were noted: 2nd degree. Repaired in routine fashion with 2-0 vicryl Mother and baby stable. Counts correct   EBL 300cc  Infant time: 70 Gender: female Placenta time: 1813 Apgars: 4/8 Weight: pending skin-to-skin

## 2020-03-28 ENCOUNTER — Encounter: Payer: Self-pay | Admitting: Obstetrics and Gynecology

## 2020-03-28 NOTE — Progress Notes (Signed)
This encounter was created in error - please disregard.

## 2020-04-21 IMAGING — US US OB COMP LESS 14 WK
1 series · 15 of 28 positions shown · non-contrast
Comparison: None.

CLINICAL DATA: Vaginal bleeding

EXAM:
OBSTETRIC <14 WK ULTRASOUND
TECHNIQUE: Transabdominal ultrasound was performed for evaluation of the
gestation as well as the maternal uterus and adnexal regions.

[Series 1: us ob comp less 14 wk · 15 of 29 slices shown]
[im 1/29]
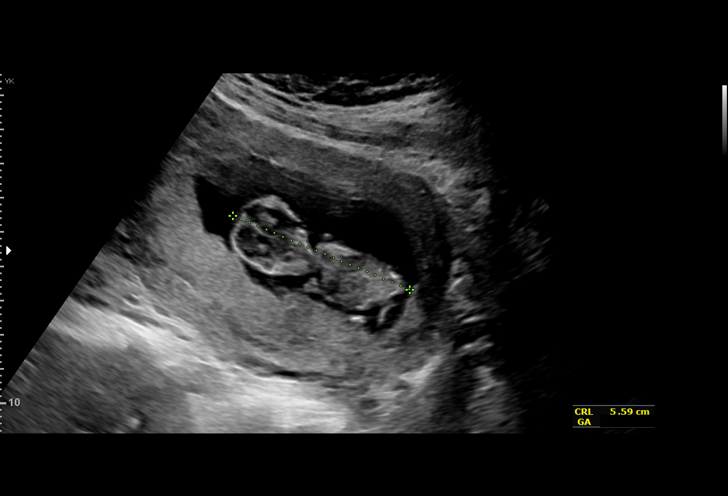
[im 3/29]
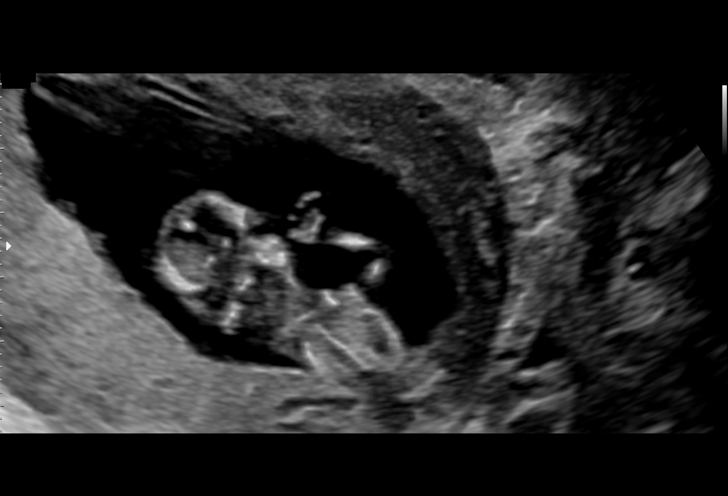
[im 5/29]
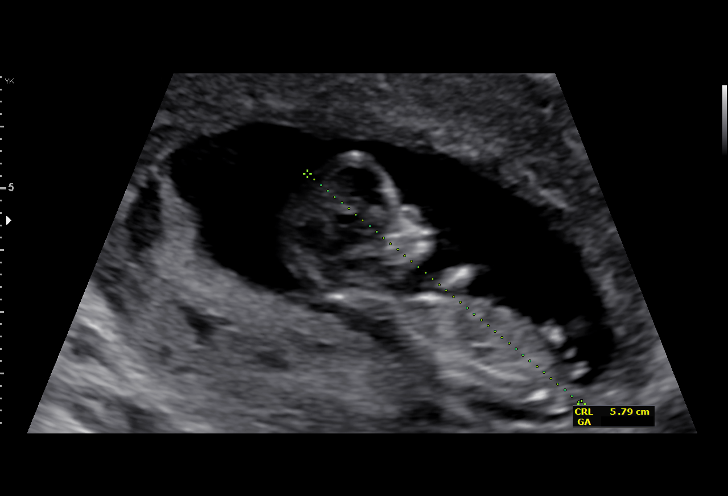
[im 7/29]
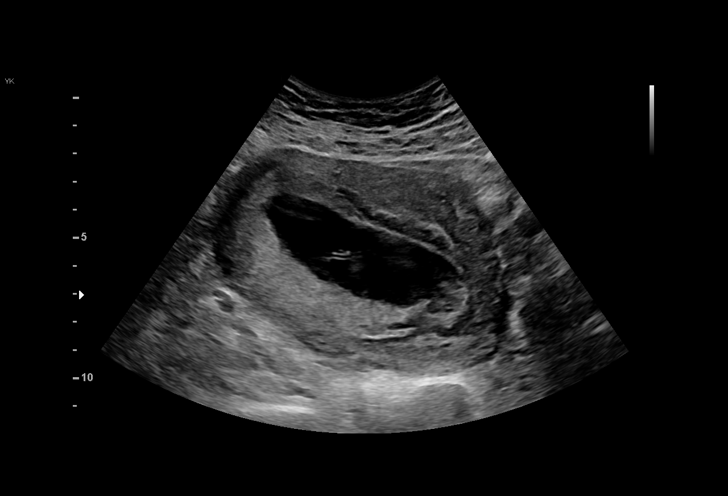
[im 9/29]
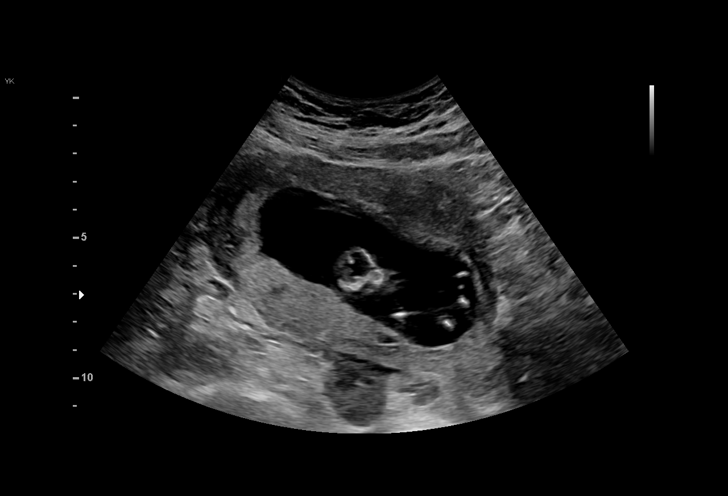
[im 11/29]
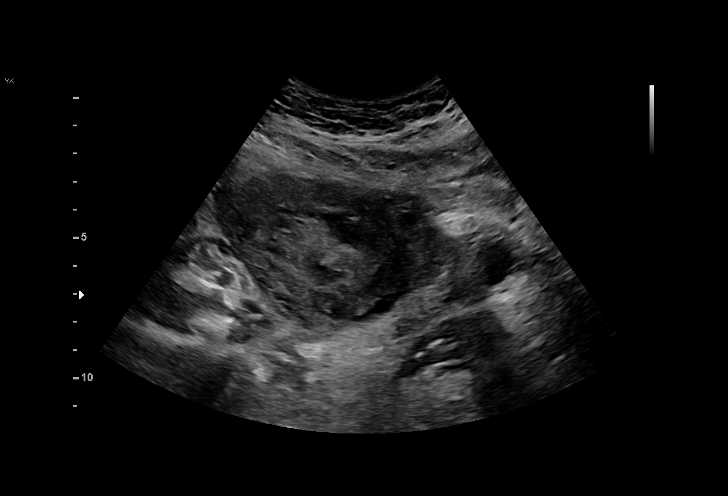
[im 13/29]
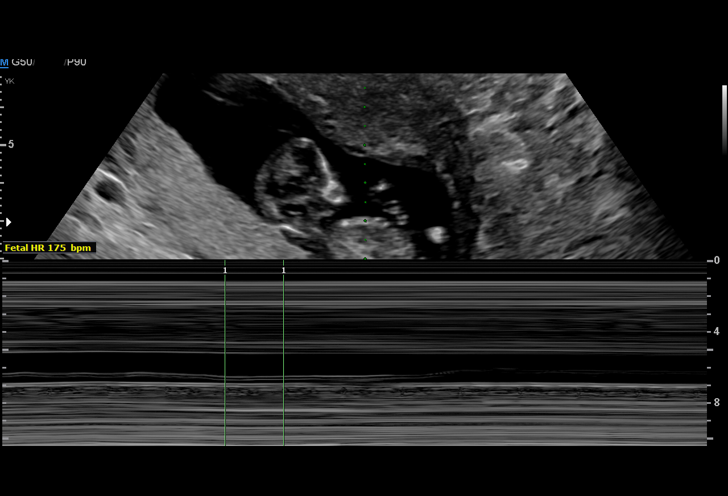
[im 15/29]
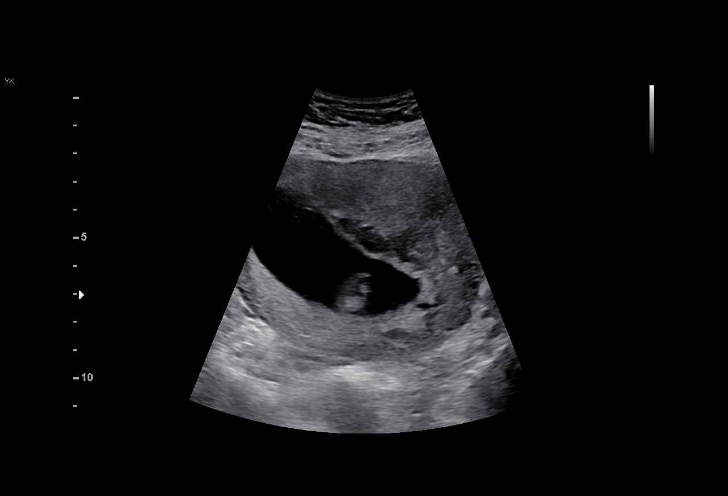
[im 16/29]
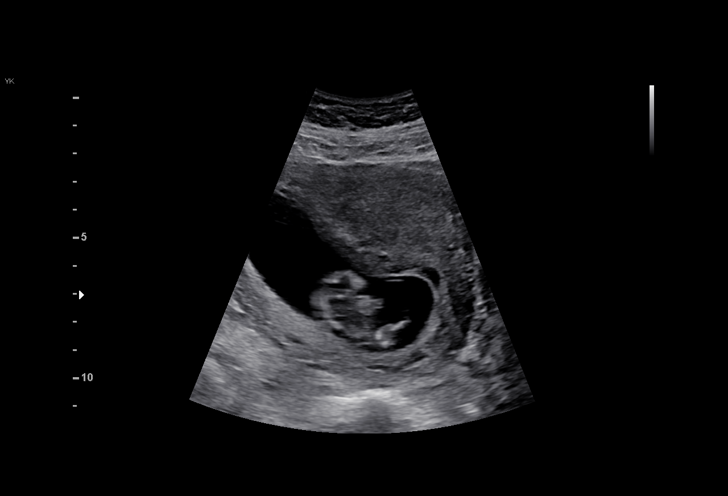
[im 18/29]
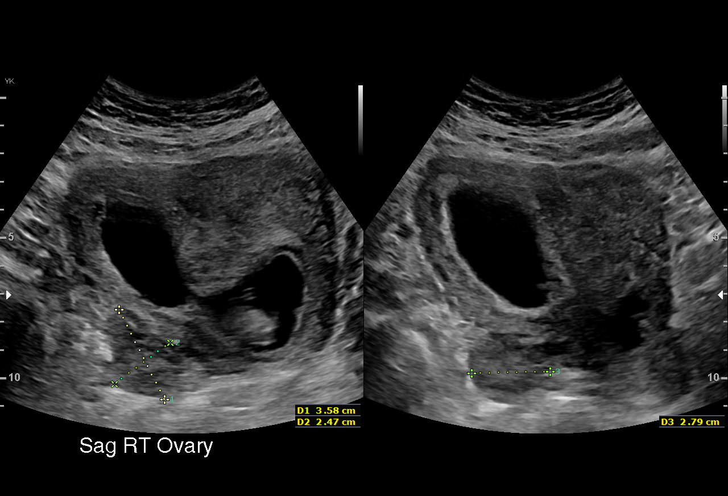
[im 20/29]
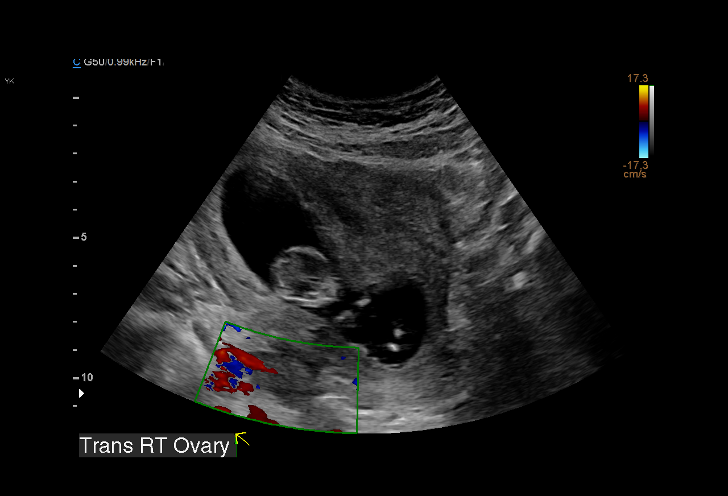
[im 22/29]
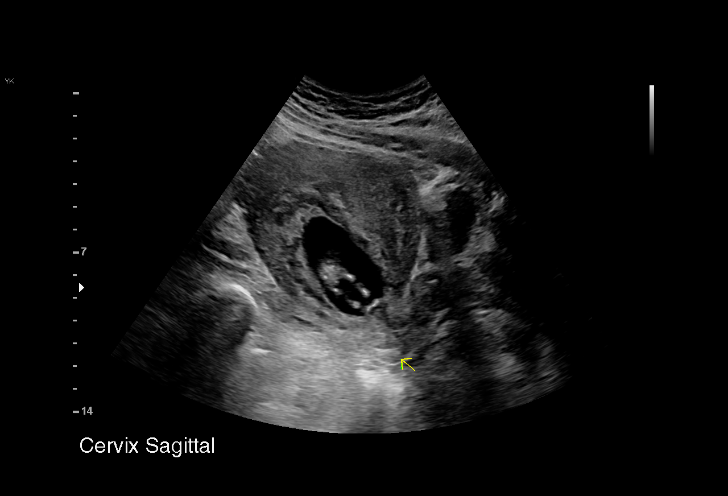
[im 24/29]
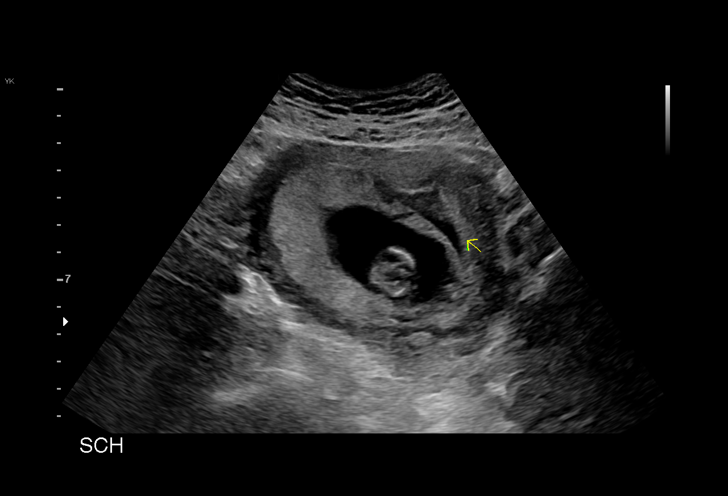
[im 26/29]
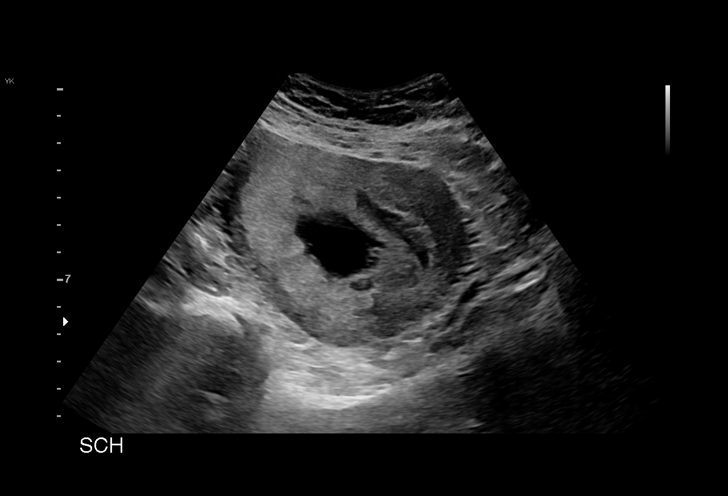
[im 29/29]
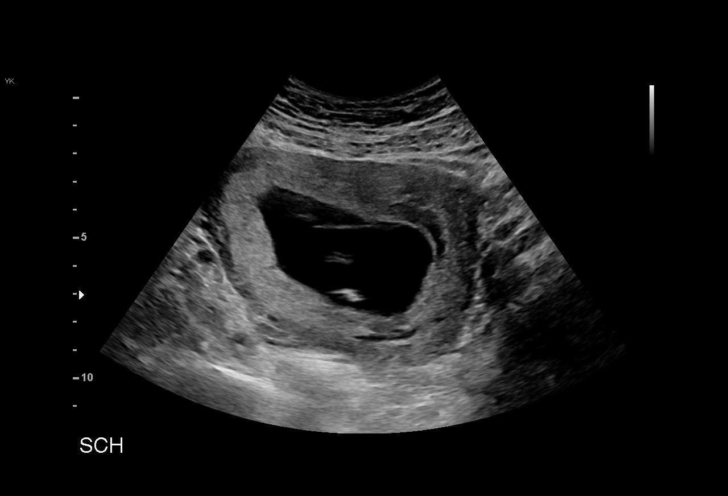

[15 of 28 positions shown; findings below may reference images not displayed]

FINDINGS: Intrauterine gestational sac: Single

Yolk sac:  Not seen

Embryo:  Visualized.

Cardiac Activity: Visualized.

Heart Rate: 169 bpm

MSD:    mm    w     d

CRL:   58 mm   12 w 2 d                  US EDC: 07/14/2019

Subchorionic hemorrhage:  Small

Maternal uterus/adnexae: Maternal ovaries appear normal and there is
no mass or free fluid identified within either adnexal region.
IMPRESSION: 1. Single live intrauterine pregnancy with estimated gestational age
of 12 weeks and 2 days.
2. Small subchorionic hemorrhage.
3. Maternal ovaries appear normal and there is no mass or free fluid
seen within either adnexal region.

## 2020-04-23 IMAGING — US US OB COMP LESS 14 WK
1 series · 15 of 28 positions shown · non-contrast
Comparison: 01/01/2019

CLINICAL DATA: Vaginal bleeding in 1st trimester pregnancy.

EXAM:
OBSTETRIC <14 WK ULTRASOUND
TECHNIQUE: Transabdominal ultrasound was performed for evaluation of the
gestation as well as the maternal uterus and adnexal regions.

[Series 1: us ob comp less 14 wk · 15 of 35 slices shown]
[im 1/35]
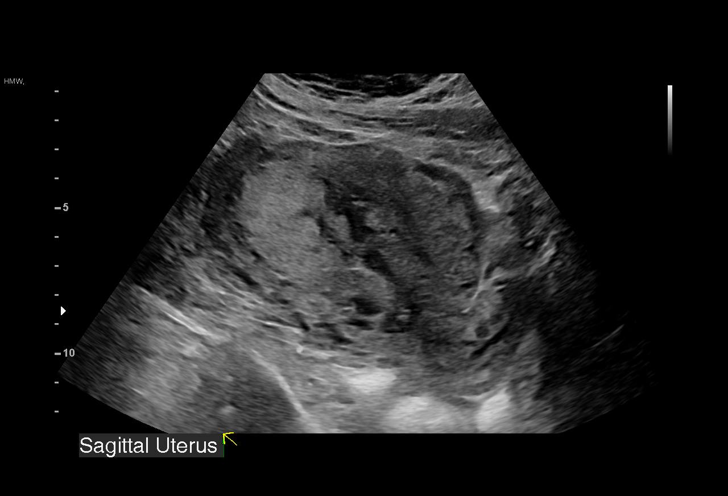
[im 3/35]
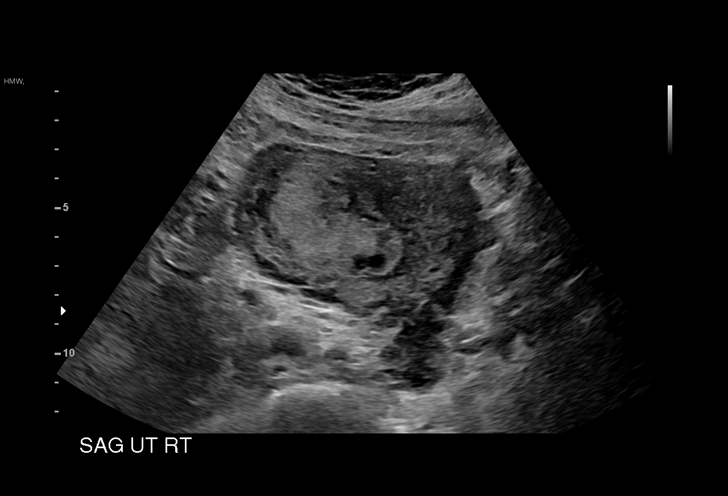
[im 6/35]
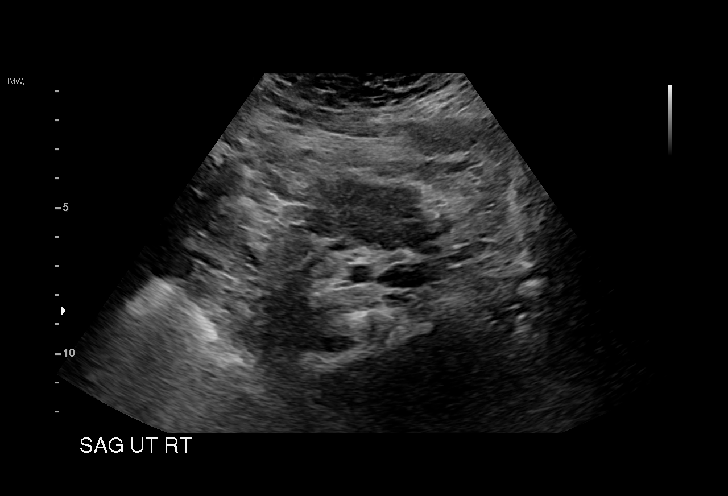
[im 8/35]
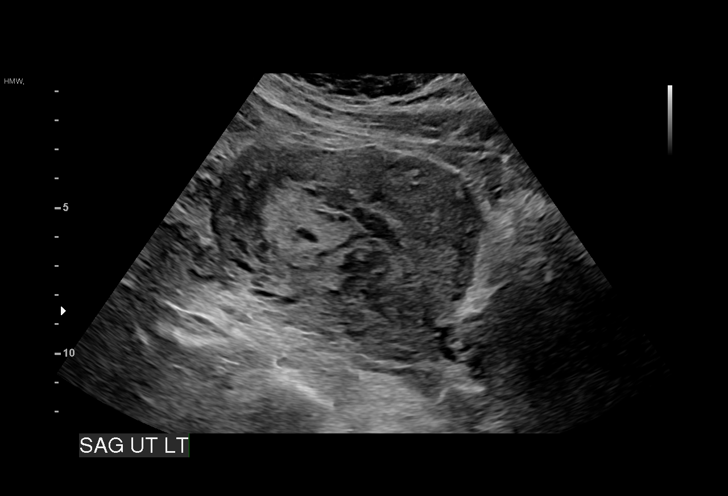
[im 11/35]
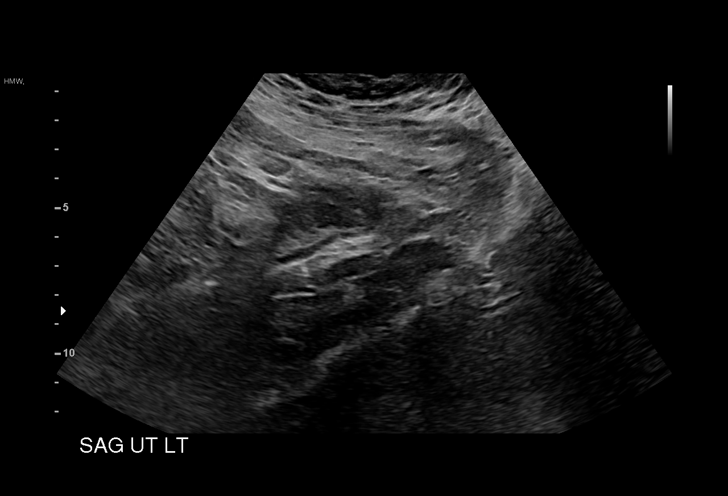
[im 13/35]
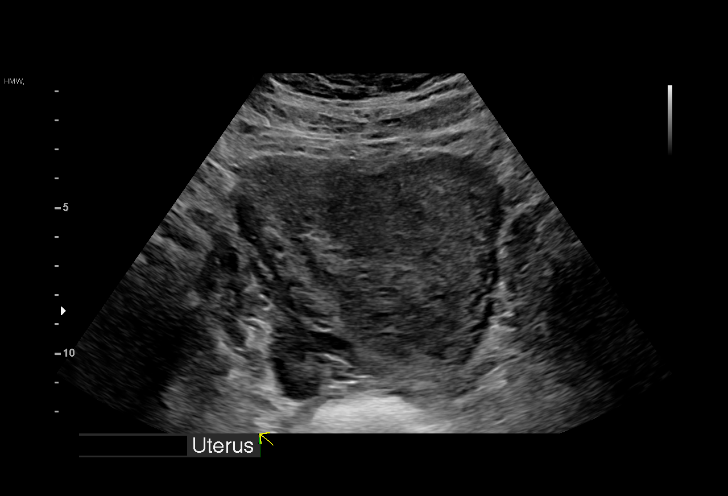
[im 16/35]
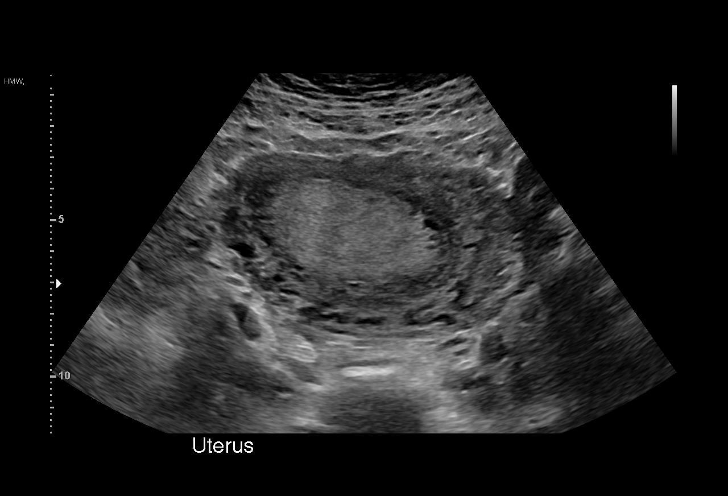
[im 18/35]
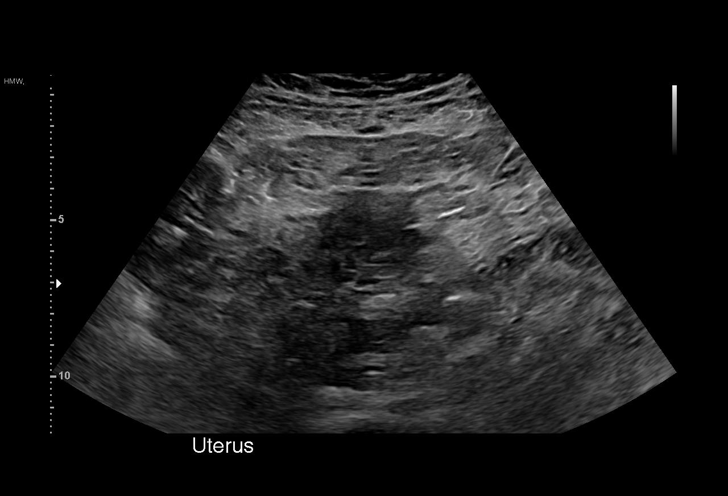
[im 19/35]
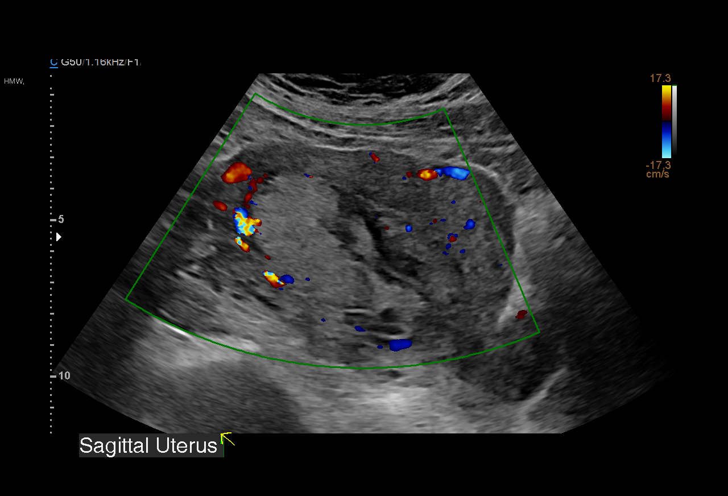
[im 22/35]
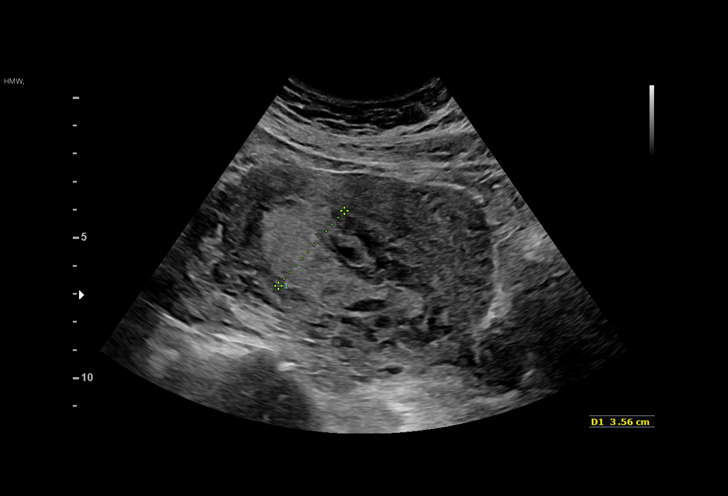
[im 24/35]
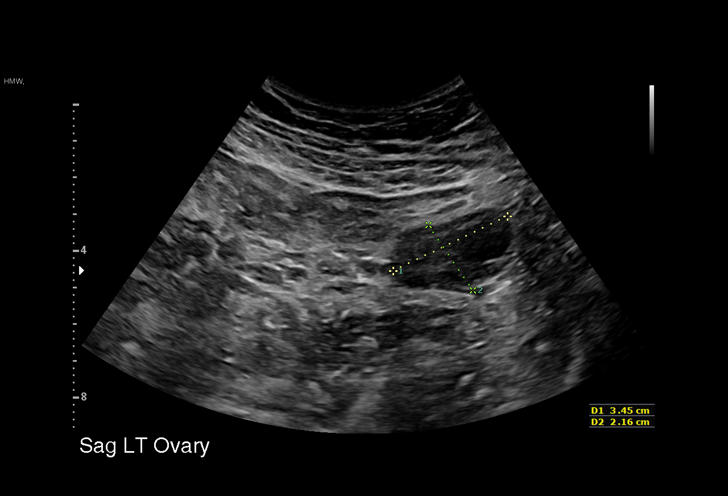
[im 27/35]
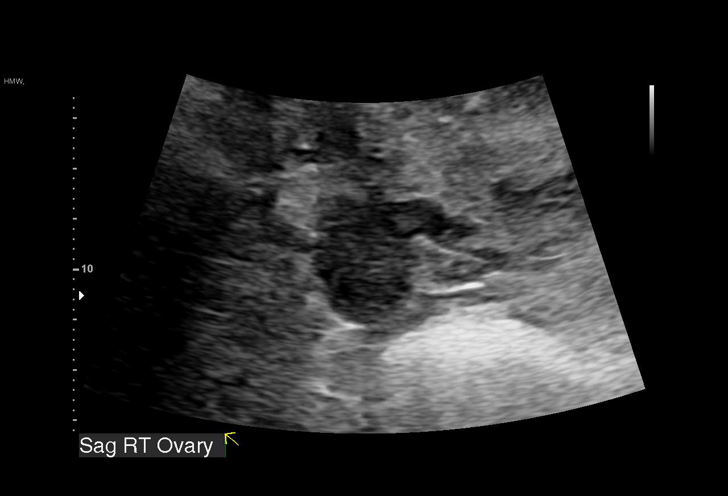
[im 29/35]
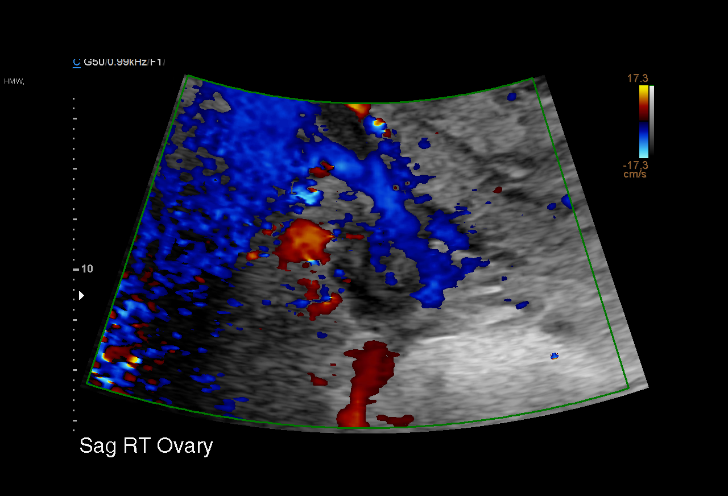
[im 32/35]
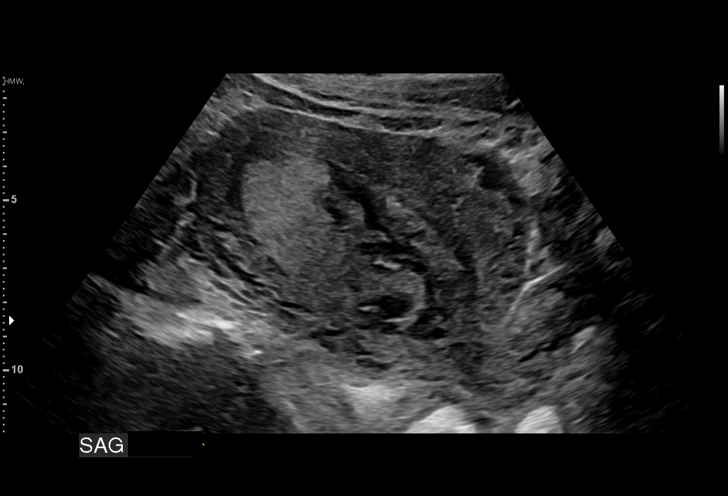
[im 35/35]
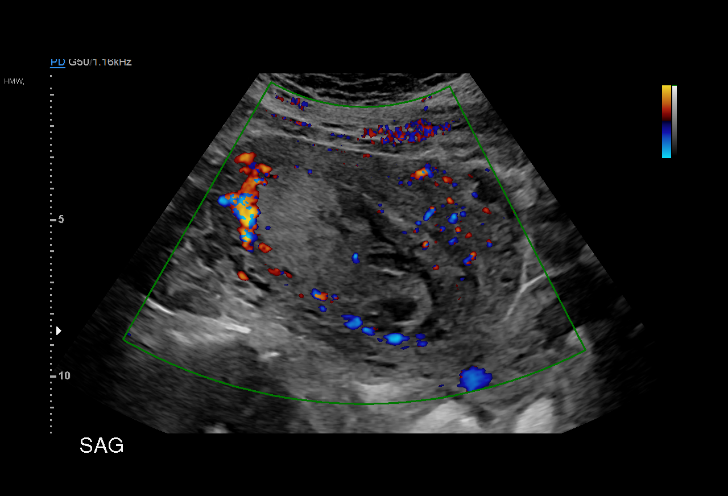

[15 of 28 positions shown; findings below may reference images not displayed]

FINDINGS: Intrauterine gestational sac: None; previously seen IUP is no longer
visualized

Maternal uterus/adnexae: Endometrium is heterogeneous and measures
37 mm in thickness. Both ovaries are normal appearance. No mass or
abnormal free fluid identified.
IMPRESSION: Previous IUP no longer visualized, consistent with interval
spontaneous abortion.

## 2020-06-27 ENCOUNTER — Encounter (HOSPITAL_COMMUNITY): Payer: Self-pay | Admitting: Obstetrics and Gynecology

## 2020-06-27 ENCOUNTER — Encounter: Payer: Medicaid Other | Attending: Obstetrics and Gynecology | Admitting: Registered"

## 2020-06-27 ENCOUNTER — Inpatient Hospital Stay (HOSPITAL_COMMUNITY)
Admission: AD | Admit: 2020-06-27 | Discharge: 2020-06-27 | Disposition: A | Discharge status: Home / Self Care | Payer: Medicaid Other | Attending: Obstetrics and Gynecology | Admitting: Obstetrics and Gynecology

## 2020-06-27 ENCOUNTER — Other Ambulatory Visit: Payer: Self-pay

## 2020-06-27 DIAGNOSIS — Z3A24 24 weeks gestation of pregnancy: Secondary | ICD-10-CM | POA: Diagnosis not present

## 2020-06-27 DIAGNOSIS — O24419 Gestational diabetes mellitus in pregnancy, unspecified control: Secondary | ICD-10-CM | POA: Diagnosis not present

## 2020-06-27 DIAGNOSIS — O368120 Decreased fetal movements, second trimester, not applicable or unspecified: Secondary | ICD-10-CM | POA: Insufficient documentation

## 2020-06-27 DIAGNOSIS — O162 Unspecified maternal hypertension, second trimester: Secondary | ICD-10-CM | POA: Diagnosis not present

## 2020-06-27 DIAGNOSIS — Z7982 Long term (current) use of aspirin: Secondary | ICD-10-CM | POA: Diagnosis not present

## 2020-06-27 DIAGNOSIS — Z8249 Family history of ischemic heart disease and other diseases of the circulatory system: Secondary | ICD-10-CM | POA: Insufficient documentation

## 2020-06-27 DIAGNOSIS — Z79899 Other long term (current) drug therapy: Secondary | ICD-10-CM | POA: Diagnosis not present

## 2020-06-27 DIAGNOSIS — Z833 Family history of diabetes mellitus: Secondary | ICD-10-CM | POA: Diagnosis not present

## 2020-06-27 DIAGNOSIS — Z3689 Encounter for other specified antenatal screening: Secondary | ICD-10-CM

## 2020-06-27 HISTORY — DX: Gestational diabetes mellitus in pregnancy, unspecified control: O24.419

## 2020-06-27 HISTORY — DX: Unspecified infectious disease: B99.9

## 2020-06-27 NOTE — Discharge Instructions (Signed)
Gestational Diabetes Mellitus, Diagnosis Gestational diabetes (gestational diabetes mellitus) is a temporary form of diabetes that some women develop during pregnancy. It usually occurs around weeks 24-28 of pregnancy, and it goes away after delivery. Hormonal changes during pregnancy can interfere with insulin production and function, which may result in one or both of these problems:  The pancreas does not make enough of a hormone called insulin.  Cells in the body do not respond properly to insulin that the body makes (insulin resistance). Normally, insulin allows blood sugar (glucose) to enter cells in the body. The cells use glucose for energy. Insulin resistance or lack of insulin causes excess glucose to build up in the blood instead of going into cells. As a result, high blood glucose (hyperglycemia) develops. If gestational diabetes is treated, it is not likely to cause problems. If it is not controlled with treatment, it may cause problems during labor and delivery, and some of those problems can be harmful to the unborn baby (fetus) and the mother. Women who get gestational diabetes are more likely to develop it if they get pregnant again, and they are more likely to develop type 2 diabetes in the future. What increases the risk? This condition may be more likely to develop in pregnant women who:  Are older than age 25 during pregnancy.  Have a family history of diabetes.  Are overweight.  Had gestational diabetes in the past.  Have polycystic ovary syndrome (PCOS).  Are pregnant with twins or multiples.  Are of American-Indian, African-American, Hispanic/Latino, or Asian/Pacific Islander descent. What are the signs or symptoms? Most women do not notice symptoms of gestational diabetes because the symptoms are similar to normal symptoms of pregnancy. Symptoms of gestational diabetes may include:  Increased thirst (polydipsia).  Increased hunger(polyphagia).  Increased  urination (polyuria). How is this diagnosed? This condition may be diagnosed based on your blood glucose level, which may be checked with one or more of the following blood tests:  A fasting blood glucose (FBG) test. You will not be allowed to eat (you will fast) for 8 hours or longer before a blood sample is taken.  A random blood glucose test. This checks your blood glucose at any time of day regardless of when you ate.  An oral glucose tolerance test (OGTT). This is usually done during weeks 24-28 of pregnancy. ? For this test, you will have an FBG test done. Then, you will drink a beverage that contains glucose. Your blood glucose will be tested again one hour after you drink the glucose beverage (1-hour OGTT). ? If the 1-hour OGTT result is at or above 140 mg/dL (7.8 mmol/L), you will repeat the OGTT. This time, your blood glucose will be tested 3 hours after you drink the glucose beverage (3-hour OGTT). If you have risk factors, you may be screened for undiagnosed type 2 diabetes at your first health care visit during your pregnancy (prenatal visit). How is this treated?     Your treatment may be managed by a specialist called an endocrinologist. This condition is treated by following instructions from your health care provider about:  Eating a healthy diet and getting more physical activity. These changes are the most important ways to manage gestational diabetes.  Checking your blood glucose. Do this as often as told.  Taking diabetes medicines or insulin every day. These will only be prescribed if they are needed. ? If you use insulin, you may need to adjust your dosage based on how physically active   you are and what foods you eat. Your health care provider will tell you how to do this. Your health care provider will set treatment goals for you based on the stage of your pregnancy and any other medical conditions you have. Generally, the goal of treatment is to maintain the following  blood glucose levels during pregnancy:  Before meals (preprandial): at or below 95 mg/dL (5.3 mmol/L).  After meals (postprandial): ? One hour after a meal: at or below 140 mg/dL (7.8 mmol/L). ? Two hours after a meal: at or below 120 mg/dL (6.7 mmol/L).  A1c (hemoglobin A1c) level: 6-6.5%. Follow these instructions at home: Questions to ask your health care provider  Consider asking the following questions: ? Do I need to meet with a diabetes educator? ? What equipment will I need to manage my diabetes at home? ? What diabetes medicines do I need, and when should I take them? ? How often do I need to check my blood glucose? ? What number can I call if I have questions? ? When is my next appointment? General instructions  Take over-the-counter and prescription medicines only as told by your health care provider.  Manage your weight gain during pregnancy. The amount of weight that you are expected to gain depends on your pre-pregnancy BMI (body mass index).  Keep all follow-up visits as told by your health care provider. This is important.  For more information about diabetes, visit: ? American Diabetes Association (ADA): www.diabetes.org ? American Association of Diabetes Educators (AADE): www.diabeteseducator.org Contact a health care provider if:  Your blood glucose level is at or above 240 mg/dL (13.3 mmol/L).  Your blood glucose level is at or above 200 mg/dL (11.1 mmol/L) and you have ketones in your urine.  You have been sick or have had a fever for 2 days or longer and you are not getting better.  You have any of the following problems for more than 6 hours: ? You cannot eat or drink. ? You have nausea and vomiting. ? You have diarrhea. Get help right away if:  Your blood glucose is lower than 54 mg/dL (3 mmol/L).  You become confused or you have trouble thinking clearly.  You have difficulty breathing.  You have moderate or large ketone levels in your  urine.  Your baby is moving around less than usual.  You develop unusual discharge or bleeding from your vagina.  You start having contractions early (prematurely). Contractions may feel like a tightening in your lower abdomen. Summary  Gestational diabetes (gestational diabetes mellitus) is a temporary form of diabetes that some women develop during pregnancy. It usually occurs around weeks 24-28 of pregnancy, and it goes away after delivery.  This condition is treated by making diet and lifestyle changes and taking diabetes medicines or insulin, if needed.  Women who get gestational diabetes are more likely to develop it if they get pregnant again, and they are more likely to develop type 2 diabetes in the future. This information is not intended to replace advice given to you by your health care provider. Make sure you discuss any questions you have with your health care provider. Document Revised: 12/31/2017 Document Reviewed: 12/28/2015 Elsevier Patient Education  2020 [employer] Inc[employer]  

## 2020-06-27 NOTE — MAU Note (Signed)
K Kooistra CNM in to see pt. After reviewing EFM strip, OK to remove EFM. Crackers and PB with apple juice to pt

## 2020-06-27 NOTE — Progress Notes (Signed)
Paulina Fusi CNM in earlier to discuss d/c plan. Written and verbal d/c instructions given and understanding voiced.

## 2020-06-27 NOTE — MAU Provider Note (Signed)
Patient Mary Donaldson is a 34 y.o. G3P0020  at [redacted]w[redacted]d here with complaints of decreased fetal movements. She denies bleeding, LOF, vaginal discharge. She has CHTN on labetelol; she had elevated blood sugar at 23 weeks, but has not been diagnosed with GDM. She reports that her provider wants her to check her sugars and watch her diet before her GDM test. She is a patient at [facility].   She reports that sometimes she has a decreased appetite.  History     CSN: [id number]  Arrival date and time: 06/27/20 1736   First Provider Initiated Contact with Patient 06/27/20 1911      Chief Complaint  Patient presents with  . Abdominal Pain  . Decreased Fetal Movement   HPI Patient reports that baby was moving less yesterday, and then even less today. She woke up at 11 am; ate breakfast, took a shower. She went to her Diabetic class, and then after her class she came her. She decided to come and get checked out since she was already out. She reports a normal diet today.  OB History    Gravida  3   Para      Term      Preterm      AB  2   Living        SAB  1   TAB  1   Ectopic      Multiple      Live Births              Past Medical History:  Diagnosis Date  . Gestational diabetes   . Hypertension   . Infection    UTI    Past Surgical History:  Procedure Laterality Date  . DILATION AND EVACUATION N/A 01/05/2019   Procedure: DILATATION AND EVACUATION;  Surgeon: Catalina Antigua, MD;  Location: [facility];  Service: Gynecology;  Laterality: N/A;    Family History  Problem Relation Age of Onset  . Diabetes Mother   . Hypertension Mother   . Healthy Father     Social History   Tobacco Use  . Smoking status: Never Smoker  . Smokeless tobacco: Never Used  Vaping Use  . Vaping Use: Never used  Substance Use Topics  . Alcohol use: Not Currently    Comment: rare  . Drug use: Never    Allergies: No Known Allergies  Medications Prior to Admission   Medication Sig Dispense Refill Last Dose  . aspirin 81 MG chewable tablet Chew 81 mg by mouth daily.   06/26/2020 at Unknown time  . labetalol (NORMODYNE) 100 MG tablet Take 100 mg by mouth 2 (two) times daily.   06/27/2020 at Unknown time  . Prenatal Vit-Fe Fumarate-FA (MULTIVITAMIN-PRENATAL) 27-0.8 MG TABS tablet Take 1 tablet by mouth daily at 12 noon. 30 tablet 11 06/26/2020 at Unknown time  . VITAMIN D, CHOLECALCIFEROL, PO Take 5,000 Units/day by mouth.   06/27/2020 at Unknown time  . cyclobenzaprine (FLEXERIL) 5 MG tablet Take 1 tablet (5 mg total) by mouth 3 (three) times daily as needed for muscle spasms. (Patient not taking: Reported on 01/31/2019) 30 tablet 0     Review of Systems  Constitutional: Negative.   HENT: Negative.   Respiratory: Negative.   Cardiovascular: Negative.   Gastrointestinal: Negative.   Genitourinary: Negative.   Musculoskeletal: Negative.   Neurological: Negative.    Physical Exam   Blood pressure 132/81, pulse (!) 105, temperature 98.6 F (37 C), temperature source Oral, resp. rate 18,  height 5\' 3"  (1.6 m), weight 78 kg, SpO2 100 %, unknown if currently breastfeeding.  Physical Exam Constitutional:      Appearance: She is well-developed.  HENT:     Head: Normocephalic.  Abdominal:     General: Abdomen is flat.     Palpations: Abdomen is soft.  Skin:    General: Skin is warm.  Neurological:     Mental Status: She is alert.     MAU Course  Procedures  MDM -NST: 140 bpm, mod var, present acel, occasional decels,  Patient declines pelvic exam and cultures.  Patient felt active fetal movements while in MAU.  Assessment and Plan   1. NST (non-stress test) reactive    2. Patient stable for discharge with plans to keep next prenatal visit on 07/28.   3. Reviewed fetal movements and importance of eating regularly. Return precautions given.  8/28 Filippo Puls 06/27/2020, 8:17 PM

## 2020-06-27 NOTE — MAU Note (Signed)
Yesterday noted less movement, today only felt 2 or 3 movements today. Little pain on the left side, is sharp but brief and very infrequent. Denies bleeding or LOF.  Denies Nausea, vomiting, diarrhea or constipation.  No urinary symptoms

## 2020-06-29 ENCOUNTER — Encounter: Payer: Self-pay | Admitting: Registered"

## 2020-06-29 DIAGNOSIS — O24419 Gestational diabetes mellitus in pregnancy, unspecified control: Secondary | ICD-10-CM | POA: Insufficient documentation

## 2020-06-29 NOTE — Progress Notes (Signed)
Patient was seen on 06/27/20 for Gestational Diabetes self-management class at the [facility]. The following learning objectives were met by the patient during this course:   States the definition of Gestational Diabetes  States why dietary management is important in controlling blood glucose  Describes the effects each nutrient has on blood glucose levels  Demonstrates ability to create a balanced meal plan  Demonstrates carbohydrate counting   States when to check blood glucose levels  Demonstrates proper blood glucose monitoring techniques  States the effect of stress and exercise on blood glucose levels  States the importance of limiting caffeine and abstaining from alcohol and smoking  Blood glucose monitor given: Accu-chek Guide Me Lot #[id number] Exp: 08/29/2021 CBG: 133 mg/dL  Patient instructed to monitor glucose levels: FBS: 60 - <95; 1 hour: <140; 2 hour: <120  Patient received handouts:  Nutrition Diabetes and Pregnancy, including carb counting list  Patient will be seen for follow-up as needed.

## 2020-07-19 ENCOUNTER — Inpatient Hospital Stay (HOSPITAL_BASED_OUTPATIENT_CLINIC_OR_DEPARTMENT_OTHER): Payer: Medicaid Other

## 2020-07-19 ENCOUNTER — Encounter (HOSPITAL_COMMUNITY): Payer: Self-pay | Admitting: Obstetrics and Gynecology

## 2020-07-19 ENCOUNTER — Inpatient Hospital Stay (HOSPITAL_COMMUNITY)
Admission: AD | Admit: 2020-07-19 | Discharge: 2020-07-19 | Disposition: A | Discharge status: Home / Self Care | Payer: Medicaid Other | Attending: Obstetrics and Gynecology | Admitting: Obstetrics and Gynecology

## 2020-07-19 ENCOUNTER — Other Ambulatory Visit: Payer: Self-pay

## 2020-07-19 DIAGNOSIS — R3129 Other microscopic hematuria: Secondary | ICD-10-CM | POA: Diagnosis not present

## 2020-07-19 DIAGNOSIS — O99891 Other specified diseases and conditions complicating pregnancy: Secondary | ICD-10-CM | POA: Diagnosis not present

## 2020-07-19 DIAGNOSIS — R000 Tachycardia, unspecified: Secondary | ICD-10-CM

## 2020-07-19 DIAGNOSIS — D72829 Elevated white blood cell count, unspecified: Secondary | ICD-10-CM | POA: Diagnosis not present

## 2020-07-19 DIAGNOSIS — O99412 Diseases of the circulatory system complicating pregnancy, second trimester: Secondary | ICD-10-CM | POA: Insufficient documentation

## 2020-07-19 DIAGNOSIS — Z3A27 27 weeks gestation of pregnancy: Secondary | ICD-10-CM | POA: Insufficient documentation

## 2020-07-19 DIAGNOSIS — O26892 Other specified pregnancy related conditions, second trimester: Secondary | ICD-10-CM

## 2020-07-19 DIAGNOSIS — R109 Unspecified abdominal pain: Secondary | ICD-10-CM | POA: Diagnosis not present

## 2020-07-19 DIAGNOSIS — O99112 Other diseases of the blood and blood-forming organs and certain disorders involving the immune mechanism complicating pregnancy, second trimester: Secondary | ICD-10-CM | POA: Diagnosis not present

## 2020-07-19 DIAGNOSIS — O10012 Pre-existing essential hypertension complicating pregnancy, second trimester: Secondary | ICD-10-CM

## 2020-07-19 DIAGNOSIS — O24419 Gestational diabetes mellitus in pregnancy, unspecified control: Secondary | ICD-10-CM

## 2020-07-19 LAB — URINALYSIS, ROUTINE W REFLEX MICROSCOPIC
Bilirubin Urine: NEGATIVE
Glucose, UA: >=500 mg/dL — AB
Ketones, ur: NEGATIVE mg/dL
Leukocytes,Ua: NEGATIVE
Nitrite: NEGATIVE
Protein, ur: NEGATIVE mg/dL
Specific Gravity, Urine: 1.018 (ref 1.005–1.030)
pH: 5 (ref 5.0–8.0)

## 2020-07-19 LAB — CBC WITH DIFFERENTIAL/PLATELET
Abs Immature Granulocytes: 0 10*3/uL (ref 0.00–0.07)
Basophils Absolute: 0 10*3/uL (ref 0.0–0.1)
Basophils Relative: 0 %
Eosinophils Absolute: 0.2 10*3/uL (ref 0.0–0.5)
Eosinophils Relative: 1 %
HCT: 31.6 % — ABNORMAL LOW (ref 36.0–46.0)
Hemoglobin: 9.6 g/dL — ABNORMAL LOW (ref 12.0–15.0)
Lymphocytes Relative: 9 %
Lymphs Abs: 1.5 10*3/uL (ref 0.7–4.0)
MCH: 18.7 pg — ABNORMAL LOW (ref 26.0–34.0)
MCHC: 30.4 g/dL (ref 30.0–36.0)
MCV: 61.6 fL — ABNORMAL LOW (ref 80.0–100.0)
Monocytes Absolute: 0.3 10*3/uL (ref 0.1–1.0)
Monocytes Relative: 2 %
Neutro Abs: 14.6 10*3/uL — ABNORMAL HIGH (ref 1.7–7.7)
Neutrophils Relative %: 88 %
Platelets: 354 10*3/uL (ref 150–400)
RBC: 5.13 MIL/uL — ABNORMAL HIGH (ref 3.87–5.11)
RDW: 15.9 % — ABNORMAL HIGH (ref 11.5–15.5)
WBC: 16.6 10*3/uL — ABNORMAL HIGH (ref 4.0–10.5)
nRBC: 0 % (ref 0.0–0.2)
nRBC: 0 /100 WBC

## 2020-07-19 MED ORDER — TRAMADOL HCL 50 MG PO TABS: 50.0000 mg | ORAL_TABLET | Freq: Four times a day (QID) | ORAL | 0 refills | Status: DC | PRN

## 2020-07-19 NOTE — Discharge Instructions (Signed)
You pain was likely caused by passing a small kidney stone. If your symptoms come back you may need further testing.    Kidney Stones  Kidney stones are solid, rock-like deposits that form inside of the kidneys. The kidneys are a pair of organs that make urine. A kidney stone may form in a kidney and move into other parts of the urinary tract, including the tubes that connect the kidneys to the bladder (ureters), the bladder, and the tube that carries urine out of the body (urethra). As the stone moves through these areas, it can cause intense pain and block the flow of urine. Kidney stones are created when high levels of certain minerals are found in the urine. The stones are usually passed out of the body through urination, but in some cases, medical treatment may be needed to remove them. What are the causes? Kidney stones may be caused by:  A condition in which certain glands produce too much parathyroid hormone (primary hyperparathyroidism), which causes too much calcium buildup in the blood.  A buildup of uric acid crystals in the bladder (hyperuricosuria). Uric acid is a chemical that the body produces when you eat certain foods. It usually exits the body in the urine.  Narrowing (stricture) of one or both of the ureters.  A kidney blockage that is present at birth (congenital obstruction).  Past surgery on the kidney or the ureters, such as gastric bypass surgery. What increases the risk? The following factors may make you more likely to develop this condition:  Having had a kidney stone in the past.  Having a family history of kidney stones.  Not drinking enough water.  Eating a diet that is high in protein, salt (sodium), or sugar.  Being overweight or obese. What are the signs or symptoms? Symptoms of a kidney stone may include:  Pain in the side of the abdomen, right below the ribs (flank pain). Pain usually spreads (radiates) to the groin.  Needing to urinate  frequently or urgently.  Painful urination.  Blood in the urine (hematuria).  Nausea.  Vomiting.  Fever and chills. How is this diagnosed? This condition may be diagnosed based on:  Your symptoms and medical history.  A physical exam.  Blood tests.  Urine tests. These may be done before and after the stone passes out of your body through urination.  Imaging tests, such as a CT scan, abdominal X-ray, or ultrasound.  A procedure to examine the inside of the bladder (cystoscopy). How is this treated? Treatment for kidney stones depends on the size, location, and makeup of the stones. Kidney stones will often pass out of the body through urination. You may need to:  Increase your fluid intake to help pass the stone. In some cases, you may be given fluids through an IV and may need to be monitored at the hospital.  Take medicine for pain.  Make changes in your diet to help prevent kidney stones from coming back. Sometimes, medical procedures are needed to remove a kidney stone. This may involve:  A procedure to break up kidney stones using: ? A focused beam of light (laser therapy). ? Shock waves (extracorporeal shock wave lithotripsy).  Surgery to remove kidney stones. This may be needed if you have severe pain or have stones that block your urinary tract. Follow these instructions at home: Medicines  Take over-the-counter and prescription medicines only as told by your health care provider.  Ask your health care provider if the medicine prescribed  to you requires you to avoid driving or using heavy machinery. Eating and drinking  Drink enough fluid to keep your urine pale yellow. You may be instructed to drink at least 8-10 glasses of water each day. This will help you pass the kidney stone.  If directed, change your diet. This may include: ? Limiting how much sodium you eat. ? Eating more fruits and vegetables. ? Limiting how much animal protein--such as red meat,  poultry, fish, and eggs--you eat.  Follow instructions from your health care provider about eating or drinking restrictions. General instructions  Collect urine samples as told by your health care provider. You may need to collect a urine sample: ? 24 hours after you pass the stone. ? 8-12 weeks after passing the kidney stone, and every 6-12 months after that.  Strain your urine every time you urinate, for as long as directed. Use the strainer that your health care provider recommends.  Do not throw out the kidney stone after passing it. Keep the stone so it can be tested by your health care provider. Testing the makeup of your kidney stone may help prevent you from getting kidney stones in the future.  Keep all follow-up visits as told by your health care provider. This is important. You may need follow-up X-rays or ultrasounds to make sure that your stone has passed. How is this prevented? To prevent another kidney stone:  Drink enough fluid to keep your urine pale yellow. This is the best way to prevent kidney stones.  Eat a healthy diet and follow recommendations from your health care provider about foods to avoid. You may be instructed to eat a low-protein diet. Recommendations vary depending on the type of kidney stone that you have.  Maintain a healthy weight. Where to find more information  National Kidney Foundation (NKF): www.kidney.org  [facility]): www.urologyhealth.org Contact a health care provider if:  You have pain that gets worse or does not get better with medicine. Get help right away if:  You have a fever or chills.  You develop severe pain.  You develop new abdominal pain.  You faint.  You are unable to urinate. Summary  Kidney stones are solid, rock-like deposits that form inside of the kidneys.  Kidney stones can cause nausea, vomiting, blood in the urine, abdominal pain, and the urge to urinate frequently.  Treatment for kidney  stones depends on the size, location, and makeup of the stones. Kidney stones will often pass out of the body through urination.  Kidney stones can be prevented by drinking enough fluids, eating a healthy diet, and maintaining a healthy weight. This information is not intended to replace advice given to you by your health care provider. Make sure you discuss any questions you have with your health care provider. Document Revised: 04/12/2019 Document Reviewed: 04/12/2019 Elsevier Patient Education  2020 Elsevier Inc.   Abdominal Pain During Pregnancy  Belly (abdominal) pain is common during pregnancy. There are many possible causes. Most of the time, it is not a serious problem. Other times, it can be a sign that something is wrong with the pregnancy. Always tell your doctor if you have belly pain. Follow these instructions at home: Do not have sex or put anything in your vagina until your pain goes away completely. Get plenty of rest until your pain gets better. Drink enough fluid to keep your pee (urine) pale yellow. Take over-the-counter and prescription medicines only as told by your doctor. Keep all follow-up  visits as told by your doctor. This is important. Contact a doctor if: Your pain continues or gets worse after resting. You have lower belly pain that: Comes and goes at regular times. Spreads to your back. Feels like menstrual cramps. You have pain or burning when you pee (urinate). Get help right away if: You have a fever or chills. You have vaginal bleeding. You are leaking fluid from your vagina. You are passing tissue from your vagina. You throw up (vomit) for more than 24 hours. You have watery poop (diarrhea) for more than 24 hours. Your baby is moving less than usual. You feel very weak or faint. You have shortness of breath. You have very bad pain in your upper belly. Summary Belly (abdominal) pain is common during pregnancy. There are many possible causes. If  you have belly pain during pregnancy, tell your doctor right away. Keep all follow-up visits as told by your doctor. This is important. This information is not intended to replace advice given to you by your health care provider. Make sure you discuss any questions you have with your health care provider. Document Revised: 03/14/2019 Document Reviewed: 02/26/2017 Elsevier Patient Education  2020 [employer].

## 2020-07-19 NOTE — MAU Note (Signed)
Pt states she was trying to go to the bathroom to pee and have a bowel movement but couldn't then started experiencing an "achy and sharp" abdominal pain along the whole right side of her abdomen. States it was a 9/10 on the pain scale, so much so that she called the paramedics but they did not transport her as the pain went away about an hour later. States she is not having any pain at the moment and does not want to have a pelvic exam done. Denies LOF/VB. Reports good fetal movement. States she forgot to take her morning dose of labetalol this morning but took it around 1700 when she remembered.

## 2020-07-19 NOTE — MAU Provider Note (Signed)
CC:  Chief Complaint  Patient presents with  . Abdominal Pain     First Provider Initiated Contact with Patient 07/19/20 1948      HPI: Mary Donaldson is a 34 y.o. year old G13P0020 female at [redacted]w[redacted]d weeks gestation who presents to MAU reporting severe right-sided pain for about 2 hours this afternoon 9/10 pain scale that has improved spontaneously. Felt pelvic pressure as if she had to have a BM or void but could not. Has been able to void since pain decreased.  Associated Sx:  Vaginal bleeding: Denies Leaking of fluid: Denies Fetal movement: Very active  Reports chronic tachycardia in the 100's. Review of records in Epic confirms.  O:  Patient Vitals for the past 24 hrs:  BP Temp Temp src Pulse Resp SpO2  07/19/20 2200 102/60 -- -- (!) 125 -- --  07/19/20 2146 (!) 110/55 -- -- (!) 123 -- --  07/19/20 2045 (!) 143/93 -- -- (!) 118 -- --  07/19/20 2040 -- -- -- -- -- 100 %  07/19/20 2035 -- -- -- -- -- 99 %  07/19/20 2030 140/88 -- -- (!) 113 -- 99 %  07/19/20 2025 -- -- -- -- -- 99 %  07/19/20 2020 -- -- -- -- -- 100 %  07/19/20 2015 130/73 -- -- (!) 107 -- 98 %  07/19/20 2010 -- -- -- -- -- 100 %  07/19/20 2005 -- -- -- -- -- 99 %  07/19/20 2000 135/83 -- -- (!) 118 -- 99 %  07/19/20 1955 -- -- -- -- -- 99 %  07/19/20 1950 -- -- -- -- -- 99 %  07/19/20 1945 132/80 -- -- (!) 114 -- 99 %  07/19/20 1930 133/90 -- -- (!) 114 -- --  07/19/20 1928 (!) 149/96 98.4 F (36.9 C) Oral (!) (P) 115 17 100 %    General: NAD Heart: Regular rate Lungs: Normal rate and effort Abd: Soft, NT, Gravid, S=D Pelvic: Declined     EFM: 140, Moderate variability, 15 x 15 accelerations, no decelerations Toco: No contractions  Results for orders placed or performed during the hospital encounter of 07/19/20 (from the past 24 hour(s))  Urinalysis, Routine w reflex microscopic Urine, Clean Catch     Status: Abnormal   Collection Time: 07/19/20  7:29 PM  Result Value Ref Range   Color, Urine  YELLOW YELLOW   APPearance HAZY (A) CLEAR   Specific Gravity, Urine 1.018 1.005 - 1.030   pH 5.0 5.0 - 8.0   Glucose, UA >=500 (A) NEGATIVE mg/dL   Hgb urine dipstick SMALL (A) NEGATIVE   Bilirubin Urine NEGATIVE NEGATIVE   Ketones, ur NEGATIVE NEGATIVE mg/dL   Protein, ur NEGATIVE NEGATIVE mg/dL   Nitrite NEGATIVE NEGATIVE   Leukocytes,Ua NEGATIVE NEGATIVE   RBC / HPF 0-5 0 - 5 RBC/hpf   WBC, UA 6-10 0 - 5 WBC/hpf   Bacteria, UA RARE (A) NONE SEEN   Squamous Epithelial / LPF 0-5 0 - 5   Mucus PRESENT    Ca Oxalate Crys, UA PRESENT   CBC with Differential/Platelet     Status: Abnormal   Collection Time: 07/19/20  7:51 PM  Result Value Ref Range   WBC 16.6 (H) 4.0 - 10.5 K/uL   RBC 5.13 (H) 3.87 - 5.11 MIL/uL   Hemoglobin 9.6 (L) 12.0 - 15.0 g/dL   HCT 99.3 (L) 36 - 46 %   MCV 61.6 (L) 80.0 - 100.0 fL   MCH 18.7 (L) 26.0 -  34.0 pg   MCHC 30.4 30.0 - 36.0 g/dL   RDW 95.6 (H) 21.3 - 08.6 %   Platelets 354 150 - 400 K/uL   nRBC 0.0 0.0 - 0.2 %   Neutrophils Relative % 88 %   Neutro Abs 14.6 (H) 1.7 - 7.7 K/uL   Lymphocytes Relative 9 %   Lymphs Abs 1.5 0.7 - 4.0 K/uL   Monocytes Relative 2 %   Monocytes Absolute 0.3 0 - 1 K/uL   Eosinophils Relative 1 %   Eosinophils Absolute 0.2 0 - 0 K/uL   Basophils Relative 0 %   Basophils Absolute 0.0 0 - 0 K/uL   WBC Morphology See Note    nRBC 0 0 /100 WBC   Abs Immature Granulocytes 0.00 0.00 - 0.07 K/uL     MAU Course  Orders Placed This Encounter  Procedures  . [place] MFM OB Limited  . Urinalysis, Routine w reflex microscopic Urine, Clean Catch  . CBC with Differential/Platelet  . Discharge patient   Meds ordered this encounter  Medications  . traMADol (ULTRAM) 50 MG tablet    Sig: Take 1-2 tablets (50-100 mg total) by mouth every 6 (six) hours as needed for severe pain.    Dispense:  20 tablet    Refill:  0    Order Specific Question:   Supervising Provider    Answer:   Adam Phenix [[id number]]   MDM - Self-limited  episode of pain of uncertain etiology but low suspicion for appendicitis due to location of pain and absence of tenderness, fever, and GI complaints. No evidence of OB emrgency--Cervix long and closed on [place]. Ovaries and placenta Nml. Mild leukocytosis that could be 2/2 pregnancy and pain. Pain possible due to passing kidney stone (Ca Oxylate crystals and hgb in urine) Encouraged to to push fluids and F/U w/ MAU if Sx return.  Will culture urine. Discussed Hx, labs, exam w/ Dr. Debroah Loop. Agrees w/ POC. New orders: None. Doubt appendicitis. Imaging for possible kidney stone not indicated emergently due to spontaneous resolution of Sx.    - Chronic. Asymptomatic tachycardia. Encouraged pot to F/U w/ PCP. Red Flags reviewed.   A: [redacted]w[redacted]d week IUP 1. Right sided abdominal pain   2. Abdominal pain during pregnancy in second trimester   3. Other microscopic hematuria   4. Leukocytosis, unspecified type   5. Chronic tachycardia     P: Discharge home in stable condition per consult with Dr. Debroah Loop. Preterm labor precautions and fetal kick counts. Comfort measure. Rx Ultram Push fluids Follow-up as scheduled for prenatal visit or sooner as needed if symptoms worsen. Return to maternity admissions as needed if symptoms worsen.  Mary Donaldson, [place], CNM 07/19/2020 10:50 PM  3

## 2020-07-21 LAB — CULTURE, OB URINE: Special Requests: NORMAL

## 2020-07-22 ENCOUNTER — Encounter: Payer: Self-pay | Admitting: Advanced Practice Midwife

## 2020-07-22 DIAGNOSIS — R8271 Bacteriuria: Secondary | ICD-10-CM | POA: Insufficient documentation

## 2020-07-30 ENCOUNTER — Encounter (HOSPITAL_COMMUNITY): Payer: Self-pay | Admitting: Obstetrics and Gynecology

## 2020-07-30 ENCOUNTER — Other Ambulatory Visit: Payer: Self-pay

## 2020-07-30 ENCOUNTER — Inpatient Hospital Stay (HOSPITAL_COMMUNITY)
Admission: AD | Admit: 2020-07-30 | Discharge: 2020-07-30 | Disposition: A | Discharge status: Home / Self Care | Payer: Medicaid Other | Attending: Obstetrics and Gynecology | Admitting: Obstetrics and Gynecology

## 2020-07-30 DIAGNOSIS — Z7982 Long term (current) use of aspirin: Secondary | ICD-10-CM | POA: Diagnosis not present

## 2020-07-30 DIAGNOSIS — O10913 Unspecified pre-existing hypertension complicating pregnancy, third trimester: Secondary | ICD-10-CM

## 2020-07-30 DIAGNOSIS — Z3A28 28 weeks gestation of pregnancy: Secondary | ICD-10-CM | POA: Diagnosis not present

## 2020-07-30 DIAGNOSIS — O24415 Gestational diabetes mellitus in pregnancy, controlled by oral hypoglycemic drugs: Secondary | ICD-10-CM | POA: Diagnosis not present

## 2020-07-30 DIAGNOSIS — Z8249 Family history of ischemic heart disease and other diseases of the circulatory system: Secondary | ICD-10-CM | POA: Insufficient documentation

## 2020-07-30 DIAGNOSIS — Z7984 Long term (current) use of oral hypoglycemic drugs: Secondary | ICD-10-CM | POA: Insufficient documentation

## 2020-07-30 DIAGNOSIS — Z3493 Encounter for supervision of normal pregnancy, unspecified, third trimester: Secondary | ICD-10-CM

## 2020-07-30 DIAGNOSIS — O368130 Decreased fetal movements, third trimester, not applicable or unspecified: Secondary | ICD-10-CM | POA: Insufficient documentation

## 2020-07-30 DIAGNOSIS — Z833 Family history of diabetes mellitus: Secondary | ICD-10-CM | POA: Diagnosis not present

## 2020-07-30 DIAGNOSIS — Z87440 Personal history of urinary (tract) infections: Secondary | ICD-10-CM | POA: Diagnosis not present

## 2020-07-30 DIAGNOSIS — Z79899 Other long term (current) drug therapy: Secondary | ICD-10-CM | POA: Insufficient documentation

## 2020-07-30 DIAGNOSIS — O10919 Unspecified pre-existing hypertension complicating pregnancy, unspecified trimester: Secondary | ICD-10-CM

## 2020-07-30 NOTE — Discharge Instructions (Signed)
Expect your baby to move daily - your baby will have times of being active and times of sleep and resting. Be seen in the office if your baby is not moving well. Be sure to take your glucose readings into the office at your visit.

## 2020-07-30 NOTE — MAU Note (Signed)
Decreased movement since Saturday night.  Has only moved a couple times today.  No pain, no bleeding or leaking.  Just tired.

## 2020-07-30 NOTE — MAU Provider Note (Signed)
History     CSN: [id number]  Arrival date and time: 07/30/20 3500   First Provider Initiated Contact with Patient 07/30/20 1931      Chief Complaint  Patient presents with  . Decreased Fetal Movement   HPI Mary Donaldson 34 y.o. [redacted]w[redacted]d  Comes to MAU as she has not been feeling as much movement since Saturday afternoon.  Called the office today but did not get a call until she was here in the hospital.  Feels some hard movements but these are decreasing.    OB History    Gravida  3   Para      Term      Preterm      AB  2   Living        SAB  1   TAB  1   Ectopic      Multiple      Live Births              Past Medical History:  Diagnosis Date  . Gestational diabetes   . Hypertension   . Infection    UTI    Past Surgical History:  Procedure Laterality Date  . DILATION AND EVACUATION N/A 01/05/2019   Procedure: DILATATION AND EVACUATION;  Surgeon: Catalina Antigua, MD;  Location: [facility];  Service: Gynecology;  Laterality: N/A;    Family History  Problem Relation Age of Onset  . Diabetes Mother   . Hypertension Mother   . Healthy Father     Social History   Tobacco Use  . Smoking status: Never Smoker  . Smokeless tobacco: Never Used  Vaping Use  . Vaping Use: Never used  Substance Use Topics  . Alcohol use: Not Currently    Comment: rare  . Drug use: Never    Allergies: No Known Allergies  Medications Prior to Admission  Medication Sig Dispense Refill Last Dose  . aspirin 81 MG chewable tablet Chew 81 mg by mouth daily.   Past Week at Unknown time  . labetalol (NORMODYNE) 100 MG tablet Take 100 mg by mouth 2 (two) times daily.   07/30/2020 at Unknown time  . metFORMIN (GLUCOPHAGE) 500 MG tablet Take by mouth 2 (two) times daily with a meal.   07/30/2020 at Unknown time  . Prenatal Vit-Fe Fumarate-FA (MULTIVITAMIN-PRENATAL) 27-0.8 MG TABS tablet Take 1 tablet by mouth daily at 12 noon. 30 tablet 11 07/30/2020 at Unknown time  . VITAMIN  D, CHOLECALCIFEROL, PO Take 5,000 Units/day by mouth.   07/30/2020 at Unknown time  . traMADol (ULTRAM) 50 MG tablet Take 1-2 tablets (50-100 mg total) by mouth every 6 (six) hours as needed for severe pain. 20 tablet 0 not taking    Review of Systems  Constitutional: Negative for fever.  Respiratory: Negative for cough and shortness of breath.   Gastrointestinal: Negative for abdominal pain, nausea and vomiting.  Genitourinary: Negative for dysuria, vaginal bleeding and vaginal discharge.   Physical Exam   Blood pressure 125/82, pulse (!) 113, temperature 98.2 F (36.8 C), temperature source Oral, resp. rate 18, height 5\' 3"  (1.6 m), weight 77.1 kg, SpO2 99 %, unknown if currently breastfeeding.  Physical Exam Vitals and nursing note reviewed.  Constitutional:      Appearance: She is well-developed.  HENT:     Head: Normocephalic.  Abdominal:     Palpations: Abdomen is soft.     Tenderness: There is no abdominal tenderness. There is no guarding or rebound.  Comments: FHT 150 baseline with moderate variability.  Has 10x10 accels.  No contractions.  No decelerations.  Strip is reassuring for gestational age.  Musculoskeletal:        General: Normal range of motion.     Cervical back: Neck supple.  Skin:    General: Skin is warm and dry.  Neurological:     Mental Status: She is alert and oriented to person, place, and time.     MAU Course  Procedures  MDM Reports fasting as 116 and 140 after meals today.  Does not have her records here.  Seems higher than targets would usually be.  Advised to take blood sugar diary to her MD's office.   Assessment and Plan  Fetal movement reassuring Chronic hypertension on medication Gestational diabetes controlled with medication  Plan NST is reassuring for the gestational age Client felt more fetal movements while here in the hospital.  Discussed that all of the movements are not large movements and how to be aware of less obvious  fetal movements. Is taking her medications as ordered and is checking and recording her blood sugars.   Will discharge home.   Currie Paris 07/30/2020, 7:43 PM

## 2020-08-18 ENCOUNTER — Inpatient Hospital Stay (HOSPITAL_COMMUNITY)
Admission: AD | Admit: 2020-08-18 | Discharge: 2020-08-19 | Disposition: A | Discharge status: Home / Self Care | Payer: Medicaid Other | Attending: Obstetrics and Gynecology | Admitting: Obstetrics and Gynecology

## 2020-08-18 ENCOUNTER — Encounter (HOSPITAL_COMMUNITY): Payer: Self-pay | Admitting: Obstetrics and Gynecology

## 2020-08-18 DIAGNOSIS — Z3A31 31 weeks gestation of pregnancy: Secondary | ICD-10-CM | POA: Insufficient documentation

## 2020-08-18 DIAGNOSIS — Z79899 Other long term (current) drug therapy: Secondary | ICD-10-CM | POA: Diagnosis not present

## 2020-08-18 DIAGNOSIS — Z0371 Encounter for suspected problem with amniotic cavity and membrane ruled out: Secondary | ICD-10-CM | POA: Diagnosis present

## 2020-08-18 DIAGNOSIS — Z3689 Encounter for other specified antenatal screening: Secondary | ICD-10-CM

## 2020-08-18 DIAGNOSIS — O10913 Unspecified pre-existing hypertension complicating pregnancy, third trimester: Secondary | ICD-10-CM | POA: Insufficient documentation

## 2020-08-18 DIAGNOSIS — O24419 Gestational diabetes mellitus in pregnancy, unspecified control: Secondary | ICD-10-CM | POA: Insufficient documentation

## 2020-08-18 DIAGNOSIS — Z7982 Long term (current) use of aspirin: Secondary | ICD-10-CM | POA: Insufficient documentation

## 2020-08-18 LAB — URINALYSIS, ROUTINE W REFLEX MICROSCOPIC
Bilirubin Urine: NEGATIVE
Glucose, UA: NEGATIVE mg/dL
Hgb urine dipstick: NEGATIVE
Ketones, ur: NEGATIVE mg/dL
Nitrite: NEGATIVE
Protein, ur: NEGATIVE mg/dL
Specific Gravity, Urine: <1.005 — ABNORMAL LOW (ref 1.005–1.030)
pH: 7 (ref 5.0–8.0)

## 2020-08-18 LAB — URINALYSIS, MICROSCOPIC (REFLEX)

## 2020-08-18 LAB — AMNISURE RUPTURE OF MEMBRANE (ROM) NOT AT ARMC: Amnisure ROM: NEGATIVE

## 2020-08-18 NOTE — MAU Note (Signed)
Pt reports to MAU stating about a hour ago she had some LOF that was clear. No bleeding. +FM. No pain.

## 2020-08-19 DIAGNOSIS — Z0371 Encounter for suspected problem with amniotic cavity and membrane ruled out: Secondary | ICD-10-CM

## 2020-08-19 DIAGNOSIS — Z3A31 31 weeks gestation of pregnancy: Secondary | ICD-10-CM

## 2020-08-19 NOTE — MAU Provider Note (Signed)
History     CSN: [id number]  Arrival date and time: 08/18/20 2240   None     Chief Complaint  Patient presents with  . Vaginal Discharge  . Rupture of Membranes   Mary Donaldson is a 34 y.o. G3P0020 at [redacted]w[redacted]d who receives care at [facility].  She presents today for Vaginal Discharge and Rupture of Membranes.  Patient states that she was sitting down and felt wet around 1030.  She reports she went to the restroom and she had urinated 2-3 drops.  She states she had no more leaking, but continued to have feeling of pressure.  Patient endorses fetal movement and denies abdominal cramping or contractions.  Patient endorses "a lot" of discharge and states she was informed that this is normal.  Patient states she does not desire a pelvic exam due to a bad experience with a previous pregnancy.    OB History    Gravida  3   Para      Term      Preterm      AB  2   Living        SAB  1   TAB  1   Ectopic      Multiple      Live Births              Past Medical History:  Diagnosis Date  . Gestational diabetes   . Hypertension   . Infection    UTI    Past Surgical History:  Procedure Laterality Date  . DILATION AND EVACUATION N/A 01/05/2019   Procedure: DILATATION AND EVACUATION;  Surgeon: Catalina Antigua, MD;  Location: [facility];  Service: Gynecology;  Laterality: N/A;    Family History  Problem Relation Age of Onset  . Diabetes Mother   . Hypertension Mother   . Healthy Father     Social History   Tobacco Use  . Smoking status: Never Smoker  . Smokeless tobacco: Never Used  Vaping Use  . Vaping Use: Never used  Substance Use Topics  . Alcohol use: Not Currently    Comment: rare  . Drug use: Never    Allergies: No Known Allergies  Medications Prior to Admission  Medication Sig Dispense Refill Last Dose  . aspirin 81 MG chewable tablet Chew 81 mg by mouth daily.   08/18/2020 at Unknown time  . Ferrous Sulfate (IRON PO) Take by mouth.    08/18/2020 at Unknown time  . insulin glargine (LANTUS) 100 UNIT/ML injection Inject into the skin daily.     . insulin glargine (LANTUS) 100 unit/mL SOPN Inject into the skin daily.   08/17/2020 at Unknown time  . labetalol (NORMODYNE) 100 MG tablet Take 100 mg by mouth 2 (two) times daily.   08/18/2020 at Unknown time  . metFORMIN (GLUCOPHAGE) 500 MG tablet Take by mouth 2 (two) times daily with a meal.   08/18/2020 at Unknown time  . pantoprazole (PROTONIX) 20 MG tablet Take 20 mg by mouth daily.   08/18/2020 at Unknown time  . Prenatal Vit-Fe Fumarate-FA (MULTIVITAMIN-PRENATAL) 27-0.8 MG TABS tablet Take 1 tablet by mouth daily at 12 noon. 30 tablet 11 08/18/2020 at Unknown time  . VITAMIN D, CHOLECALCIFEROL, PO Take 5,000 Units/day by mouth.   08/18/2020 at Unknown time    Review of Systems  Genitourinary: Negative for difficulty urinating, dysuria, vaginal bleeding and vaginal discharge.   Physical Exam   Blood pressure 132/90, pulse (!) 106, resp. rate 19, unknown if  currently breastfeeding.  Physical Exam Constitutional:      Appearance: Normal appearance.  HENT:     Head: Normocephalic and atraumatic.  Eyes:     Conjunctiva/sclera: Conjunctivae normal.  Cardiovascular:     Rate and Rhythm: Normal rate.  Pulmonary:     Effort: Pulmonary effort is normal. No respiratory distress.  Abdominal:     Comments: Gravid   Musculoskeletal:        General: Normal range of motion.     Cervical back: Normal range of motion.  Neurological:     Mental Status: She is alert and oriented to person, place, and time.  Psychiatric:        Mood and Affect: Mood normal.        Thought Content: Thought content normal.     Fetal Assessment 145 bpm, Mod Var, -Decels, +15x15Accels Toco: No Ctx graphed  MAU Course   Results for orders placed or performed during the hospital encounter of 08/18/20 (from the past 24 hour(s))  Urinalysis, Routine w reflex microscopic Urine, Clean Catch     Status:  Abnormal   Collection Time: 08/18/20 11:14 PM  Result Value Ref Range   Color, Urine YELLOW YELLOW   APPearance CLEAR CLEAR   Specific Gravity, Urine <1.005 (L) 1.005 - 1.030   pH 7.0 5.0 - 8.0   Glucose, UA NEGATIVE NEGATIVE mg/dL   Hgb urine dipstick NEGATIVE NEGATIVE   Bilirubin Urine NEGATIVE NEGATIVE   Ketones, ur NEGATIVE NEGATIVE mg/dL   Protein, ur NEGATIVE NEGATIVE mg/dL   Nitrite NEGATIVE NEGATIVE   Leukocytes,Ua MODERATE (A) NEGATIVE  Urinalysis, Microscopic (reflex)     Status: Abnormal   Collection Time: 08/18/20 11:14 PM  Result Value Ref Range   RBC / HPF 0-5 0 - 5 RBC/hpf   WBC, UA 0-5 0 - 5 WBC/hpf   Bacteria, UA RARE (A) NONE SEEN   Squamous Epithelial / LPF 6-10 0 - 5  Amnisure rupture of membrane (rom)not at [facility]     Status: None   Collection Time: 08/18/20 11:16 PM  Result Value Ref Range   Amnisure ROM NEGATIVE    No results found.  MDM PE Labs:UA, Amnisure EFM Assessment and Plan  34 year old G3P0020  SIUP at 58.4weeks Cat I FT Leakage of Fluid  -POC Reviewed. -Nurse instructed to obtain amnisure prior to provider at bedside. -Amnisure results reviewed with patient. -Patient reassured that it is normal to have increased vaginal discharge in pregnancy.  Instructed to monitor for abnormal changes. -Patient requests ultrasound to evaluate amniotic fluid and informed that this request could be made with her primary ob. -Patient without further questions or concerns. -NST Reactive. -Encouraged to call or return to MAU if symptoms worsen or with the onset of new symptoms. -Discharged to home in stable condition.  Cherre Robins MSN, CNM 08/19/2020, 12:01 AM

## 2020-08-19 NOTE — Discharge Instructions (Signed)

## 2020-09-27 ENCOUNTER — Other Ambulatory Visit: Payer: Self-pay | Admitting: Obstetrics and Gynecology

## 2020-09-27 NOTE — H&P (Signed)
Mary Donaldson is a 34 y.o. female presenting for scheduled IOL for CHTN on meds plus GDM controlled on insulin. +FM, denies VB, LOF, CTX  PNC c/b 1) IVF: frozen embryo transfer 2) CHTN on meds: Lab 100mg  BID. Pressures started to elevate in last weeks, increasing edema but no worsening labs 3) GDM on meds - metformin 1000mg  BID plus Lantus 40u QHS 4) Beta thal trait 5) GBS bacteriuria - treated appropriately   OB History    Gravida  3   Para      Term      Preterm      AB  2   Living        SAB  1   TAB  1   Ectopic      Multiple      Live Births             Past Medical History:  Diagnosis Date  . Gestational diabetes   . Hypertension   . Infection    UTI   Past Surgical History:  Procedure Laterality Date  . DILATION AND EVACUATION N/A 01/05/2019   Procedure: DILATATION AND EVACUATION;  Surgeon: , MD;  Location: [facility];  Service: Gynecology;  Laterality: N/A;   Family History: family history includes Diabetes in her mother; Healthy in her father; Hypertension in her mother. Social History:  reports that she has never smoked. She has never used smokeless tobacco. She reports previous alcohol use. She reports that she does not use drugs.     Maternal Diabetes: Yes:  Diabetes Type:  Insulin/Medication controlled Genetic Screening: Normal Maternal Ultrasounds/Referrals: Normal Fetal Ultrasounds or other Referrals:  None Maternal Substance Abuse:  No Significant Maternal Medications:  Meds include: Other: Labetalol Significant Maternal Lab Results:  Group B Strep positive Other Comments:  None  Review of Systems  Constitutional: Negative for chills and fever.  Respiratory: Negative for shortness of breath.   Cardiovascular: Negative for chest pain, palpitations and leg swelling.  Gastrointestinal: Negative for abdominal pain and vomiting.  Neurological: Negative for dizziness, weakness and headaches.  Psychiatric/Behavioral: Negative  for suicidal ideas.   Maternal Medical History:  Contractions: Frequency: rare.    Fetal activity: Perceived fetal activity is normal.    Prenatal complications: PIH (on Lab 100mg  BID).   Prenatal Complications - Diabetes: gestational. Diabetes is managed by insulin injections.        unknown if currently breastfeeding. Exam Physical Exam Constitutional:      General: She is not in acute distress.    Appearance: She is well-developed.  HENT:     Head: Normocephalic and atraumatic.  Eyes:     Pupils: Pupils are equal, round, and reactive to light.  Cardiovascular:     Rate and Rhythm: Normal rate and regular rhythm.     Heart sounds: No murmur heard.  No gallop.   Abdominal:     Tenderness: There is no abdominal tenderness. There is no guarding or rebound.  Genitourinary:    Vagina: Normal.  Musculoskeletal:        General: Normal range of motion.     Cervical back: Normal range of motion and neck supple.  Skin:    General: Skin is warm and dry.  Neurological:     Mental Status: She is alert and oriented to person, place, and time.     Prenatal labs: ABO, Rh:  Apos Antibody:  neg Rubella:  Imm RPR:   nr HBsAg:   neg HIV:  nr GBS:   POS  Assessment/Plan: This is a 34yo [id number] @ 37 2/7 admitted for IOL for [facility] on meds plus GDM on metformin/insulin. Antenatal surveillance overall WNL. Treat as GBS pos, baby boy.    Admit to L&D, cytotec for cervical ripening. May have epidural. Plan for AROM and pitocin when amenable  Carlisle Cater 09/27/2020, 7:27 PM

## 2020-09-27 NOTE — H&P (Deleted)
  The note originally documented on this encounter has been moved the the encounter in which it belongs.  

## 2020-09-28 ENCOUNTER — Inpatient Hospital Stay (HOSPITAL_COMMUNITY): Payer: Medicaid Other | Admitting: Anesthesiology

## 2020-09-28 ENCOUNTER — Inpatient Hospital Stay (HOSPITAL_COMMUNITY): Payer: Medicaid Other

## 2020-09-28 ENCOUNTER — Inpatient Hospital Stay (HOSPITAL_COMMUNITY)
Admission: AD | Admit: 2020-09-28 | Discharge: 2020-09-30 | DRG: 807 | Disposition: A | Discharge status: Home / Self Care | Payer: Medicaid Other | Attending: Obstetrics and Gynecology | Admitting: Obstetrics and Gynecology

## 2020-09-28 ENCOUNTER — Encounter (HOSPITAL_COMMUNITY)
Admission: AD | Disposition: A | Discharge status: Home / Self Care | Payer: Self-pay | Source: Home / Self Care | Attending: Obstetrics and Gynecology

## 2020-09-28 ENCOUNTER — Encounter (HOSPITAL_COMMUNITY): Payer: Self-pay | Admitting: Obstetrics and Gynecology

## 2020-09-28 DIAGNOSIS — O10919 Unspecified pre-existing hypertension complicating pregnancy, unspecified trimester: Secondary | ICD-10-CM | POA: Diagnosis present

## 2020-09-28 DIAGNOSIS — O99824 Streptococcus B carrier state complicating childbirth: Secondary | ICD-10-CM | POA: Diagnosis present

## 2020-09-28 DIAGNOSIS — O0990 Supervision of high risk pregnancy, unspecified, unspecified trimester: Secondary | ICD-10-CM

## 2020-09-28 DIAGNOSIS — Z20822 Contact with and (suspected) exposure to covid-19: Secondary | ICD-10-CM | POA: Diagnosis present

## 2020-09-28 DIAGNOSIS — O1002 Pre-existing essential hypertension complicating childbirth: Secondary | ICD-10-CM | POA: Diagnosis present

## 2020-09-28 DIAGNOSIS — O24424 Gestational diabetes mellitus in childbirth, insulin controlled: Secondary | ICD-10-CM | POA: Diagnosis present

## 2020-09-28 DIAGNOSIS — Z3A37 37 weeks gestation of pregnancy: Secondary | ICD-10-CM

## 2020-09-28 LAB — RESPIRATORY PANEL BY RT PCR (FLU A&B, COVID)
Influenza A by PCR: NEGATIVE
Influenza B by PCR: NEGATIVE
SARS Coronavirus 2 by RT PCR: NEGATIVE

## 2020-09-28 LAB — HEPATITIS B SURFACE ANTIGEN: Hepatitis B Surface Ag: NONREACTIVE

## 2020-09-28 LAB — TYPE AND SCREEN
ABO/RH(D): A POS
Antibody Screen: NEGATIVE

## 2020-09-28 LAB — COMPREHENSIVE METABOLIC PANEL
ALT: 15 U/L (ref 0–44)
AST: 22 U/L (ref 15–41)
Albumin: 2.6 g/dL — ABNORMAL LOW (ref 3.5–5.0)
Alkaline Phosphatase: 137 U/L — ABNORMAL HIGH (ref 38–126)
Anion gap: 10 (ref 5–15)
BUN: 6 mg/dL (ref 6–20)
CO2: 18 mmol/L — ABNORMAL LOW (ref 22–32)
Calcium: 9.5 mg/dL (ref 8.9–10.3)
Chloride: 105 mmol/L (ref 98–111)
Creatinine, Ser: 0.58 mg/dL (ref 0.44–1.00)
GFR, Estimated: >60 mL/min (ref >60)
Glucose, Bld: 119 mg/dL — ABNORMAL HIGH (ref 70–99)
Potassium: 4.2 mmol/L (ref 3.5–5.1)
Sodium: 133 mmol/L — ABNORMAL LOW (ref 135–145)
Total Bilirubin: 0.6 mg/dL (ref 0.3–1.2)
Total Protein: 6.8 g/dL (ref 6.5–8.1)

## 2020-09-28 LAB — CBC
HCT: 35.3 % — ABNORMAL LOW (ref 36.0–46.0)
HCT: 36.9 % (ref 36.0–46.0)
Hemoglobin: 10.6 g/dL — ABNORMAL LOW (ref 12.0–15.0)
Hemoglobin: 11.3 g/dL — ABNORMAL LOW (ref 12.0–15.0)
MCH: 17.7 pg — ABNORMAL LOW (ref 26.0–34.0)
MCH: 17.9 pg — ABNORMAL LOW (ref 26.0–34.0)
MCHC: 30 g/dL (ref 30.0–36.0)
MCHC: 30.6 g/dL (ref 30.0–36.0)
MCV: 59 fL — ABNORMAL LOW (ref 80.0–100.0)
MCV: 58.6 fL — ABNORMAL LOW (ref 80.0–100.0)
Platelets: 383 10*3/uL (ref 150–400)
Platelets: 385 10*3/uL (ref 150–400)
RBC: 5.98 MIL/uL — ABNORMAL HIGH (ref 3.87–5.11)
RBC: 6.3 MIL/uL — ABNORMAL HIGH (ref 3.87–5.11)
RDW: 18.1 % — ABNORMAL HIGH (ref 11.5–15.5)
RDW: 18.1 % — ABNORMAL HIGH (ref 11.5–15.5)
WBC: 14.6 10*3/uL — ABNORMAL HIGH (ref 4.0–10.5)
WBC: 16.1 10*3/uL — ABNORMAL HIGH (ref 4.0–10.5)
nRBC: 0.1 % (ref 0.0–0.2)
nRBC: 0 % (ref 0.0–0.2)

## 2020-09-28 LAB — GLUCOSE, CAPILLARY
Glucose-Capillary: 93 mg/dL (ref 70–99)
Glucose-Capillary: 100 mg/dL — ABNORMAL HIGH (ref 70–99)
Glucose-Capillary: 93 mg/dL (ref 70–99)
Glucose-Capillary: 103 mg/dL — ABNORMAL HIGH (ref 70–99)

## 2020-09-28 LAB — RPR: RPR Ser Ql: NONREACTIVE

## 2020-09-28 SURGERY — Surgical Case
Anesthesia: Regional

## 2020-09-28 MED ORDER — OXYCODONE-ACETAMINOPHEN 5-325 MG PO TABS: 2.0000 | ORAL_TABLET | ORAL | Status: DC | PRN

## 2020-09-28 MED ORDER — LABETALOL HCL 5 MG/ML IV SOLN
20.0000 mg | INTRAVENOUS | Status: DC | PRN
  Administered 2020-09-28: 20 mg via INTRAVENOUS
  Filled 2020-09-28 (×2): qty 4

## 2020-09-28 MED ORDER — DIBUCAINE (PERIANAL) 1 % EX OINT: 1.0000 "application " | TOPICAL_OINTMENT | CUTANEOUS | Status: DC | PRN

## 2020-09-28 MED ORDER — OXYTOCIN-SODIUM CHLORIDE 30-0.9 UT/500ML-% IV SOLN: 2.5000 [IU]/h | INTRAVENOUS | Status: DC

## 2020-09-28 MED ORDER — LIDOCAINE HCL (PF) 1 % IJ SOLN: 30.0000 mL | INTRAMUSCULAR | Status: DC | PRN

## 2020-09-28 MED ORDER — MISOPROSTOL 25 MCG QUARTER TABLET
25.0000 ug | ORAL_TABLET | ORAL | Status: DC | PRN
  Administered 2020-09-28 (×2): 25 ug via VAGINAL
  Filled 2020-09-28 (×2): qty 1

## 2020-09-28 MED ORDER — SIMETHICONE 80 MG PO CHEW: 80.0000 mg | CHEWABLE_TABLET | ORAL | Status: DC | PRN

## 2020-09-28 MED ORDER — HYDRALAZINE HCL 20 MG/ML IJ SOLN
10.0000 mg | INTRAMUSCULAR | Status: DC | PRN
  Administered 2020-09-28: 10 mg via INTRAVENOUS
  Filled 2020-09-28: qty 1

## 2020-09-28 MED ORDER — CEFAZOLIN SODIUM-DEXTROSE 2-4 GM/100ML-% IV SOLN
2.0000 g | Freq: Once | INTRAVENOUS | Status: AC
  Administered 2020-09-28: 2 g via INTRAVENOUS
  Filled 2020-09-28: qty 100

## 2020-09-28 MED ORDER — ONDANSETRON HCL 4 MG/2ML IJ SOLN: 4.0000 mg | INTRAMUSCULAR | Status: DC | PRN

## 2020-09-28 MED ORDER — TERBUTALINE SULFATE 1 MG/ML IJ SOLN: 0.2500 mg | Freq: Once | INTRAMUSCULAR | Status: DC | PRN

## 2020-09-28 MED ORDER — LABETALOL HCL 5 MG/ML IV SOLN
80.0000 mg | INTRAVENOUS | Status: DC | PRN
  Administered 2020-09-28: 80 mg via INTRAVENOUS
  Filled 2020-09-28: qty 16

## 2020-09-28 MED ORDER — SOD CITRATE-CITRIC ACID 500-334 MG/5ML PO SOLN: 30.0000 mL | ORAL | Status: DC | PRN

## 2020-09-28 MED ORDER — LABETALOL HCL 100 MG PO TABS
100.0000 mg | ORAL_TABLET | Freq: Two times a day (BID) | ORAL | Status: DC
  Administered 2020-09-28 – 2020-09-30 (×5): 100 mg via ORAL
  Filled 2020-09-28 (×5): qty 1

## 2020-09-28 MED ORDER — DOCUSATE SODIUM 100 MG PO CAPS
100.0000 mg | ORAL_CAPSULE | Freq: Two times a day (BID) | ORAL | Status: DC
  Administered 2020-09-29 – 2020-09-30 (×3): 100 mg via ORAL
  Filled 2020-09-28 (×4): qty 1

## 2020-09-28 MED ORDER — BUTORPHANOL TARTRATE 1 MG/ML IJ SOLN
1.0000 mg | INTRAMUSCULAR | Status: DC | PRN
  Administered 2020-09-28: 1 mg via INTRAVENOUS
  Filled 2020-09-28: qty 1

## 2020-09-28 MED ORDER — ONDANSETRON HCL 4 MG/2ML IJ SOLN: 4.0000 mg | Freq: Four times a day (QID) | INTRAMUSCULAR | Status: DC | PRN

## 2020-09-28 MED ORDER — ZOLPIDEM TARTRATE 5 MG PO TABS: 5.0000 mg | ORAL_TABLET | Freq: Every evening | ORAL | Status: DC | PRN

## 2020-09-28 MED ORDER — COCONUT OIL OIL: 1.0000 "application " | TOPICAL_OIL | Status: DC | PRN

## 2020-09-28 MED ORDER — LACTATED RINGERS IV SOLN: INTRAVENOUS | Status: DC

## 2020-09-28 MED ORDER — OXYCODONE HCL 5 MG PO TABS: 5.0000 mg | ORAL_TABLET | ORAL | Status: DC | PRN

## 2020-09-28 MED ORDER — SENNOSIDES-DOCUSATE SODIUM 8.6-50 MG PO TABS
2.0000 | ORAL_TABLET | ORAL | Status: DC
  Filled 2020-09-28 (×2): qty 2

## 2020-09-28 MED ORDER — LIDOCAINE HCL (PF) 1 % IJ SOLN
INTRAMUSCULAR | Status: DC | PRN
  Administered 2020-09-28 (×2): 4 mL via EPIDURAL
  Administered 2020-09-28: 6 mL via EPIDURAL

## 2020-09-28 MED ORDER — OXYCODONE HCL 5 MG PO TABS: 10.0000 mg | ORAL_TABLET | ORAL | Status: DC | PRN

## 2020-09-28 MED ORDER — IBUPROFEN 600 MG PO TABS
600.0000 mg | ORAL_TABLET | Freq: Four times a day (QID) | ORAL | Status: DC
  Administered 2020-09-28 – 2020-09-30 (×5): 600 mg via ORAL
  Filled 2020-09-28 (×7): qty 1

## 2020-09-28 MED ORDER — LABETALOL HCL 100 MG PO TABS: 100.0000 mg | ORAL_TABLET | Freq: Two times a day (BID) | ORAL | Status: DC

## 2020-09-28 MED ORDER — SODIUM CHLORIDE (PF) 0.9 % IJ SOLN
INTRAMUSCULAR | Status: DC | PRN
  Administered 2020-09-28: 12 mL/h via EPIDURAL

## 2020-09-28 MED ORDER — FENTANYL-BUPIVACAINE-NACL 0.5-0.125-0.9 MG/250ML-% EP SOLN
EPIDURAL | Status: AC
  Filled 2020-09-28: qty 250

## 2020-09-28 MED ORDER — LIDOCAINE HCL (PF) 1 % IJ SOLN: INTRAMUSCULAR | Status: DC | PRN

## 2020-09-28 MED ORDER — ACETAMINOPHEN 325 MG PO TABS: 650.0000 mg | ORAL_TABLET | ORAL | Status: DC | PRN

## 2020-09-28 MED ORDER — ONDANSETRON HCL 4 MG PO TABS: 4.0000 mg | ORAL_TABLET | ORAL | Status: DC | PRN

## 2020-09-28 MED ORDER — LACTATED RINGERS IV SOLN: 500.0000 mL | INTRAVENOUS | Status: DC | PRN

## 2020-09-28 MED ORDER — PHENYLEPHRINE 40 MCG/ML (10ML) SYRINGE FOR IV PUSH (FOR BLOOD PRESSURE SUPPORT)
PREFILLED_SYRINGE | INTRAVENOUS | Status: AC
  Filled 2020-09-28: qty 10

## 2020-09-28 MED ORDER — FLEET ENEMA 7-19 GM/118ML RE ENEM: 1.0000 | ENEMA | RECTAL | Status: DC | PRN

## 2020-09-28 MED ORDER — OXYTOCIN-SODIUM CHLORIDE 30-0.9 UT/500ML-% IV SOLN
1.0000 m[IU]/min | INTRAVENOUS | Status: DC
  Administered 2020-09-28: 2 m[IU]/min via INTRAVENOUS
  Filled 2020-09-28: qty 500

## 2020-09-28 MED ORDER — LABETALOL HCL 5 MG/ML IV SOLN
40.0000 mg | INTRAVENOUS | Status: DC | PRN
  Administered 2020-09-28: 40 mg via INTRAVENOUS
  Filled 2020-09-28: qty 8

## 2020-09-28 MED ORDER — PANTOPRAZOLE SODIUM 20 MG PO TBEC
20.0000 mg | DELAYED_RELEASE_TABLET | Freq: Every day | ORAL | Status: DC
  Filled 2020-09-28: qty 1

## 2020-09-28 MED ORDER — WITCH HAZEL-GLYCERIN EX PADS: 1.0000 "application " | MEDICATED_PAD | CUTANEOUS | Status: DC | PRN

## 2020-09-28 MED ORDER — PRENATAL MULTIVITAMIN CH
1.0000 | ORAL_TABLET | Freq: Every day | ORAL | Status: DC
  Administered 2020-09-29: 1 via ORAL

## 2020-09-28 MED ORDER — DIPHENHYDRAMINE HCL 25 MG PO CAPS: 25.0000 mg | ORAL_CAPSULE | Freq: Four times a day (QID) | ORAL | Status: DC | PRN

## 2020-09-28 MED ORDER — BENZOCAINE-MENTHOL 20-0.5 % EX AERO
1.0000 "application " | INHALATION_SPRAY | CUTANEOUS | Status: DC | PRN
  Administered 2020-09-28: 1 via TOPICAL
  Filled 2020-09-28: qty 56

## 2020-09-28 MED ORDER — TETANUS-DIPHTH-ACELL PERTUSSIS 5-2.5-18.5 LF-MCG/0.5 IM SUSP: 0.5000 mL | Freq: Once | INTRAMUSCULAR | Status: DC

## 2020-09-28 MED ORDER — PENICILLIN G POT IN DEXTROSE 60000 UNIT/ML IV SOLN
3.0000 10*6.[IU] | INTRAVENOUS | Status: DC
  Administered 2020-09-28 (×3): 3 10*6.[IU] via INTRAVENOUS
  Filled 2020-09-28 (×3): qty 50

## 2020-09-28 MED ORDER — SODIUM CHLORIDE 0.9 % IV SOLN
5.0000 10*6.[IU] | Freq: Once | INTRAVENOUS | Status: AC
  Administered 2020-09-28: 5 10*6.[IU] via INTRAVENOUS
  Filled 2020-09-28: qty 5

## 2020-09-28 MED ORDER — OXYTOCIN BOLUS FROM INFUSION
333.0000 mL | Freq: Once | INTRAVENOUS | Status: AC
  Administered 2020-09-28: 333 mL via INTRAVENOUS

## 2020-09-28 MED ORDER — OXYCODONE-ACETAMINOPHEN 5-325 MG PO TABS: 1.0000 | ORAL_TABLET | ORAL | Status: DC | PRN

## 2020-09-28 NOTE — Progress Notes (Signed)
Labor Note  S: Some cramping in back, otherwise denies VB, LOF. Reviewed dietary options appropriate with DM and early labor. Denies PreE symptoms  O: BP (!) 158/95    Pulse 96    Temp 98.3 F (36.8 C) (Oral)    Resp 18    Ht 5\' 3"  (1.6 m)    Wt 78.8 kg    BMI 30.79 kg/m  CE: 1-2/40/-2 FHR: Baseline 135, +accels, -decels, mod variability TOCO irritability only  A/P: This is a 34 y.o. G3P0020 at [redacted]w[redacted]d  admitted for IOL for CHTN on meds and GDM on insulin. GBS pos, baby boy FWB: Category 1 tracing MWB: Minimal pain. Asx from SI-PreE standpoint. Continue Lab 100mg  BID Labor course: S/p cytotec PV x2, last dose 0607. Recheck approx 1000, pitocin if amenable. Pt declining epidural at this time  Anticipate SVD

## 2020-09-28 NOTE — Lactation Note (Signed)
This note was copied from a baby's chart. Lactation Consultation Note  Patient Name: Mary Donaldson Date: 09/28/2020 Reason for consult: Initial assessment;1st time breastfeeding P1, 5 hour ETI female infant. Mom with hx: IVF, CHTN and GDM on insulin.  Per mom, infant latched well and he  BF for 15 minutes before going to [employer] due low body temperatures ( hypothermia). Infant not present in room when [facility] entered the room. Mom's current feeding plan is breast and formula feeding. LC discussed hand expression and mom easily expressed 3 mls of colostrum in bullet. LC alert RN and she will take EBM to [facility] to offer infant before infant being supplemented with formula. Mom will start pumping every 3 hours for 15 minutes on initial setting to help establish milk supply and due infant being separated in Central Nurs[facility] at this time.  Mom knows to call RN or LC if she has any questions, concerns or need assistance with latching infant at the breast. Mom shown how to use DEBP & how to disassemble, clean, & reassemble parts. Mom made aware of O/P services, breastfeeding support groups, community resources, and our phone # for post-discharge questions.  Mom's current feeding plan once infant returns to room. 1. Mom will BF infant according to cues, 8 to 12+ times within 24 hours, STS. 2. Mom will offer infant any EBM first that is hand expressed or pump before supplementing infant with formula. 3. Mom will use DEBP every 3 hours for 15 minutes on initial setting and give infant back any EBM that is pumped.   Maternal Data Formula Feeding for Exclusion: Yes Reason for exclusion: Mother's choice to formula and breast feed on admission Has patient been taught Hand Expression?: Yes Does the patient have breastfeeding experience prior to this delivery?: No  Feeding Feeding Type: Breast Fed  LATCH Score             Interventions Interventions: Breast feeding  basics reviewed;Skin to skin;Hand express;Expressed milk;DEBP  Lactation Tools Discussed/Used WIC Program: Yes Pump Review: Setup, frequency, and cleaning;Milk Storage Initiated by:: Danelle Earthly, IBCLC Date initiated:: 09/28/20   Consult Status Consult Status: Follow-up Date: 09/29/20 Follow-up type: In-patient    Danelle Earthly 09/28/2020, 11:36 PM

## 2020-09-28 NOTE — Progress Notes (Signed)
Clear AROM at 1414, CE unchanged at 2-3/50/-3. Pt intolerant of checks, still declining epidural. Asked about concerns, worried she will have "back pain for life;" stated that this may not happen to everyone, decision is hers alone to make. Pt understands IV meds are PRN only and can't be given in advanced dilation. P{Itocin at 12 mU/min  BP (!) 136/101   Pulse 95   Temp 98 F (36.7 C)   Resp 18   Ht 5\' 3"  (1.6 m)   Wt 78.8 kg   BMI 30.79 kg/m

## 2020-09-28 NOTE — Anesthesia Procedure Notes (Addendum)
Epidural Patient location during procedure: OB Start time: 09/28/2020 4:57 PM End time: 09/28/2020 5:13 PM  Staffing Anesthesiologist: Lucretia Kern, MD Performed: anesthesiologist   Preanesthetic Checklist Completed: patient identified, IV checked, risks and benefits discussed, monitors and equipment checked, pre-op evaluation and timeout performed  Epidural Patient position: sitting Prep: DuraPrep Patient monitoring: heart rate, continuous pulse ox and blood pressure Approach: midline Location: L3-L4 Injection technique: LOR air  Needle:  Needle type: Tuohy  Needle gauge: 17 G Needle length: 9 cm Needle insertion depth: 5 cm Catheter type: closed end flexible Catheter size: 19 Gauge Catheter at skin depth: 10 cm Test dose: negative  Assessment Events: blood not aspirated, injection not painful, no injection resistance, no paresthesia and negative IV test  Additional Notes Reason for block:procedure for pain

## 2020-09-28 NOTE — Anesthesia Preprocedure Evaluation (Signed)
Anesthesia Evaluation  Patient identified by MRN, date of birth, ID band Patient awake    Reviewed: Allergy & Precautions, H&P , NPO status , Patient's Chart, lab work & pertinent test results  History of Anesthesia Complications Negative for: history of anesthetic complications  Airway Mallampati: II  TM Distance: >3 FB Neck ROM: full    Dental no notable dental hx.    Pulmonary neg pulmonary ROS,    Pulmonary exam normal        Cardiovascular hypertension, Normal cardiovascular exam Rhythm:regular Rate:Normal     Neuro/Psych negative neurological ROS  negative psych ROS   GI/Hepatic negative GI ROS, Neg liver ROS,   Endo/Other  diabetes, Gestational  Renal/GU negative Renal ROS  negative genitourinary   Musculoskeletal negative musculoskeletal ROS (+)   Abdominal   Peds  Hematology negative hematology ROS (+)   Anesthesia Other Findings   Reproductive/Obstetrics (+) Pregnancy                             Anesthesia Physical Anesthesia Plan  ASA: II  Anesthesia Plan: Epidural   Post-op Pain Management:    Induction:   PONV Risk Score and Plan:   Airway Management Planned:   Additional Equipment:   Intra-op Plan:   Post-operative Plan:   Informed Consent: I have reviewed the patients History and Physical, chart, labs and discussed the procedure including the risks, benefits and alternatives for the proposed anesthesia with the patient or authorized representative who has indicated his/her understanding and acceptance.       Plan Discussed with:   Anesthesia Plan Comments:         Anesthesia Quick Evaluation

## 2020-09-29 LAB — CBC
HCT: 30.9 % — ABNORMAL LOW (ref 36.0–46.0)
Hemoglobin: 9.6 g/dL — ABNORMAL LOW (ref 12.0–15.0)
MCH: 18 pg — ABNORMAL LOW (ref 26.0–34.0)
MCHC: 31.1 g/dL (ref 30.0–36.0)
MCV: 58.1 fL — ABNORMAL LOW (ref 80.0–100.0)
Platelets: 355 10*3/uL (ref 150–400)
RBC: 5.32 MIL/uL — ABNORMAL HIGH (ref 3.87–5.11)
RDW: 16.9 % — ABNORMAL HIGH (ref 11.5–15.5)
WBC: 18.9 10*3/uL — ABNORMAL HIGH (ref 4.0–10.5)
nRBC: 0 % (ref 0.0–0.2)

## 2020-09-29 LAB — GLUCOSE, CAPILLARY
Glucose-Capillary: 98 mg/dL (ref 70–99)
Glucose-Capillary: 108 mg/dL — ABNORMAL HIGH (ref 70–99)
Glucose-Capillary: 65 mg/dL — ABNORMAL LOW (ref 70–99)

## 2020-09-29 NOTE — Progress Notes (Signed)
POSTPARTUM PROGRESS NOTE  Post Partum Day #1  Subjective:  No acute events overnight.  Pt denies problems with ambulating, voiding or po intake.  She denies nausea or vomiting.  Pain is well controlled. Lochia Minimal. Patient desires circumcision for baby boy.   Objective: Blood pressure 118/61, pulse (!) 112, temperature 98.1 F (36.7 C), temperature source Oral, resp. rate 18, height 5\' 3"  (1.6 m), weight 78.8 kg, SpO2 98 %, unknown if currently breastfeeding.  Physical Exam:  General: alert, cooperative and no distress Lochia:normal flow Chest: CTAB Heart: RRR no m/r/g Abdomen: +BS, soft, nontender Uterine Fundus: firm, 2cm below umbilicus GU: suture intact, healing well, no purulent drainage Extremities: neg edema, neg calf TTP BL, neg Homans BL  Recent Labs    09/28/20 1615 09/29/20 0348  HGB 11.3* 9.6*  HCT 36.9 30.9*   BG 98, 108 (fasting 1hr pp) Assessment/Plan:  ASSESSMENT: Mary Donaldson is a 34 y.o. 34 s/p VAVD for FHR decels and poor maternal effort @ [redacted]w[redacted]d. PNC c/b CHTN on meds, GDM on meds.  CHTN: continue lab 100mg  BID, asx at this time, normotensive currently GDM: continue BG given patient's increased med needs antepartum.  WNL thus far  Plan for discharge tomorrow and Breastfeeding  Baby boy working on maintaining temperature prior to first bath. During inspection for circumcision preparation, shaft noted to be curved inferiorly so that ventral skin palpates tighter, concern for denudement of shaft with circ. Will defer to outpatient peds urology at this time   LOS: 1 day

## 2020-09-29 NOTE — Progress Notes (Signed)
Dr. Reina Fuse notified of 63 cbg on patient. Patient asymptomatic and has not had a meal since early this morning. Patient drank one orange juice given by RN. Dr. Reina Fuse has no new orders for patient but to have a meal and drink the orange juice.  Dr. Reina Fuse also says q4 cbg checks can discontinue.   Results for ROSELINDA, BAHENA (MRN [id number]) as of 09/29/2020 16:38  Ref. Range 09/29/2020 16:21  Glucose-Capillary Latest Ref Range: 70 - 99 mg/dL 65 (L)

## 2020-09-29 NOTE — Lactation Note (Addendum)
This note was copied from a baby's chart. Lactation Consultation Note  Patient Name: Mary Donaldson GGYIR'S Date: 09/29/2020   Infant is 37 weeks 29 hours old with 2% weight loss. Mom has nursed 15-20 minutes based on cues. Mom stated she did not have any pain with the latch and thought infant was doing well. Infant did not void for over 20 hours and was started on Similac Neosure 22 calorie 1520 taking 29ml, 47ml and 34ml with last three feedings. Infant had 2 stools since birth.   Mom stated infant completed feeding at the breast for 30 minutes one hour prior to the start of this visit. As a result, she did not want to latch the baby. Mom's nipples were erect with no signs of nipple trauma or abrasion. RN to provide coconut oil for nipple care.   Mom states DEBP was set up but she did not use it. LC reviewed with Mom the behavior of LPTI and how to reduce calorie loss. Infant had some temperature instability earlier and the room is warm with the infant swaddled. LC reviewed with Mom that infant must be placed STS with just hat and pamper to keep him active and alert during feedings at the breast.    LC got Mom pumping using the DEBP increased the flange size from 24 to 27. LC reviewed with Mom signs of milk transfer, ways to keep infant active at the breast and how the baby will be more open and relax at the end of the feed. The importance of keeping the feed under 30 minutes. LC reviewed with Mom LPTI supplementation guideline to ensure she supplements with either EBM or Similac Neosure 22 calorie using paced bottle feeding and extra slow flow nipple based on hours after birth.   Mom states infant had some spit up with the Similac Neosure = < 0.23ml. LC told Mom she could call the RN to observe the next feed to see how he does with the Neosure.   LC discussed Mom's concerns about the formula with RN, Neale Burly. RN will observe feeding with Neosure to see if there is any signs of intolerance.   Total bilirubin also in high intermediate range.   Infant voided 2x following start of formula.   Plan 1. To feed based on cues 8-12x in 24 hour period no more than 3 hours without attempt.           2. To supplement after a feed using EBM or Similac Neosure based on LPTI guidelines reviewed.          3. Pump using DEBP for q 3hours for 15 minutes. Pump parts, assembly and cleaning reviewed.

## 2020-09-29 NOTE — Anesthesia Postprocedure Evaluation (Signed)
Anesthesia Post Note  Patient: Mary Donaldson  Procedure(s) Performed: AN AD HOC LABOR EPIDURAL     Patient location during evaluation: Mother Baby Anesthesia Type: Epidural Level of consciousness: awake and alert Pain management: pain level controlled Vital Signs Assessment: post-procedure vital signs reviewed and stable Respiratory status: spontaneous breathing, nonlabored ventilation and respiratory function stable Cardiovascular status: stable Postop Assessment: no headache, no backache and epidural receding Anesthetic complications: no   No complications documented.  Last Vitals:  Vitals:   09/29/20 0200 09/29/20 0528  BP: 137/83 129/85  Pulse: (!) 115 (!) 107  Resp: 19 18  Temp: 36.6 C 37 C  SpO2: 95% 98%    Last Pain:  Vitals:   09/29/20 0816  TempSrc:   PainSc: 0-No pain   Pain Goal:                   Madison Hickman

## 2020-09-30 ENCOUNTER — Ambulatory Visit: Payer: Self-pay

## 2020-09-30 MED ORDER — IBUPROFEN 800 MG PO TABS: 800.0000 mg | ORAL_TABLET | Freq: Three times a day (TID) | ORAL | 1 refills | Status: AC | PRN

## 2020-09-30 NOTE — Lactation Note (Signed)
This note was copied from a baby's chart. Lactation Consultation Note  Patient Name: Mary Donaldson [id number] Date: 09/30/2020 Reason for consult: Follow-up assessment  Follow up visit to 45 hours old LPTI with 6.54% weight loss. Mother states she is waiting to feed infant because he is going to get a bath. Mother has been supplementing with formula because infant has not been feeding well at breast.  Mother states she is going to start pumping again but her nipples are sore. Talked about latching infant before bath because then it would be longer than three hours. Assisted mother with modified cradle latch to right breast. Provided support with pillows. Infant unable to latch, holds nipple in mouth.  Discussed pacing while bottle-feeding formula, demonstrated technique. Assisted to upright position and infant took ~63mL of formula. Mother holding STS after feeding.  Reinforced using DEBP to stimulate breasts and to supplement. Promoted maternal rest, hydration and food intake. Encouraged to contact [facility] for support when ready to breastfeed baby and recommended to request help for questions or concerns.   Plan: 1-Breastfeed no longer than 20 minutes to preserve energy 2-Pump using initiation setting or hand express to supplement 3-Spoon or fingerfeed EBM  4-Pacing and upright position when bottle-feeding formula. 5-Contact LC for support   All questions answered at this time.   Feeding Feeding Type: Breast Fed Nipple Type: Extra Slow Flow  LATCH Score Latch: Repeated attempts needed to sustain latch, nipple held in mouth throughout feeding, stimulation needed to elicit sucking reflex.  Audible Swallowing: A few with stimulation  Type of Nipple: Everted at rest and after stimulation  Comfort (Breast/Nipple): Filling, red/small blisters or bruises, mild/mod discomfort  Hold (Positioning): Assistance needed to correctly position infant at breast and maintain latch.  LATCH Score:  6  Interventions Interventions: Breast feeding basics reviewed;Assisted with latch;Skin to skin;Breast massage;Hand express;Adjust position;DEBP;Support pillows;Position options  Lactation Tools Discussed/Used     Consult Status Consult Status: Follow-up Date: 10/01/20 Follow-up type: In-patient    Lamond Glantz A Higuera Ancidey 09/30/2020, 3:43 PM

## 2020-09-30 NOTE — Progress Notes (Signed)
POSTPARTUM PROGRESS NOTE  Post Partum Day #2  Subjective:  No acute events overnight.  Pt denies problems with ambulating, voiding or po intake.  She denies nausea or vomiting.  Pain is well controlled. Lochia Minimal. Baby feeding is improving however he has to stay to monitor weight gain  Objective: Blood pressure (!) 144/96, pulse 100, temperature 98 F (36.7 C), temperature source Oral, resp. rate 16, height 5\' 3"  (1.6 m), weight 78.8 kg, SpO2 100 %, unknown if currently breastfeeding.  Physical Exam:  General: alert, cooperative and no distress Lochia:normal flow Chest: CTAB Heart: RRR no m/r/g Abdomen: +BS, soft, nontender Uterine Fundus: firm, 2cm below umbilicus GU: suture intact, healing well, no purulent drainage Extremities: neg edema, neg calf TTP BL, neg Homans BL  Recent Labs    09/28/20 1615 09/29/20 0348  HGB 11.3* 9.6*  HCT 36.9 30.9*   BG 98, 108 (fasting 1hr pp) Assessment/Plan:  ASSESSMENT: Silvana Holecek is a 34 y.o. 34 s/p VAVD for FHR decels and poor maternal effort @ [redacted]w[redacted]d. PNC c/b CHTN on meds, GDM on meds.  CHTN: continue lab 100mg  BID, asx at this time GDM: will need 2hr GTT at postpartum  Discharge home and Breastfeeding  Baby boy working on maintaining temperature prior to first bath. During inspection for circumcision preparation, shaft noted to be curved inferiorly so that ventral skin palpates tighter, concern for denudement of shaft with circ. Will defer to outpatient peds urology at this time  Baby boy to stay. Mom ready for discharge at this time   LOS: 2 days

## 2020-09-30 NOTE — Discharge Summary (Signed)
Postpartum Discharge Summary  Date of Service updated     Patient Name: Mary Donaldson DOB: 1986-08-25 MRN: [id number]  Date of admission: 09/28/2020 Delivery date:09/28/2020  Delivering provider: Carlisle Cater  Date of discharge: 09/30/2020  Admitting diagnosis: Chronic hypertension affecting pregnancy [O10.919] Intrauterine pregnancy: [redacted]w[redacted]d     Secondary diagnosis:  Active Problems:   Chronic hypertension affecting pregnancy  Additional problems: GDMA2    Discharge diagnosis: Term Pregnancy Delivered and CHTN                                              Post partum procedures:none Augmentation: AROM, Pitocin and Cytotec Complications: None  Hospital course: Induction of Labor With Vaginal Delivery   34 y.o. yo [id number] at [redacted]w[redacted]d was admitted to the hospital 09/28/2020 for induction of labor.  Indication for induction: CHTN on meds, GFMA2.  Patient had an uncomplicated labor course as follows: Membrane Rupture Time/Date: 2:14 PM ,09/28/2020   Delivery Method:Vaginal, Vacuum (Extractor)  (poor maternal effort, FHR decels) Episiotomy: None  Lacerations:  2nd degree  Details of delivery can be found in separate delivery note.  Patient had a routine postpartum course. Patient is discharged home 09/30/20.  Newborn Data: Birth date:09/28/2020  Birth time:6:09 PM  Gender:Female  Living status:Living  Apgars:4 ,8  Weight:2295 g   Magnesium Sulfate received: No BMZ received: No Rhophylac:No MMR:No T-DaP:Given prenatally Flu: Yes Transfusion:No  Physical exam  Vitals:   09/29/20 1919 09/29/20 1920 09/30/20 0522 09/30/20 1011  BP: (!) 142/94  124/73 (!) 144/96  Pulse: (!) 119 (!) 101 93 100  Resp: 16  16   Temp: 98.3 F (36.8 C)  98 F (36.7 C)   TempSrc: Oral  Oral   SpO2: 100%     Weight:      Height:       General: alert, cooperative and no distress Lochia: appropriate Uterine Fundus: firm Incision: N/A DVT Evaluation: No evidence of DVT seen on  physical exam. Negative Homan's sign. No cords or calf tenderness. Labs: Lab Results  Component Value Date   WBC 18.9 (H) 09/29/2020   HGB 9.6 (L) 09/29/2020   HCT 30.9 (L) 09/29/2020   MCV 58.1 (L) 09/29/2020   PLT 355 09/29/2020   CMP Latest Ref Rng & Units 09/28/2020  Glucose 70 - 99 mg/dL 132(G)  BUN 6 - 20 mg/dL 6  Creatinine 4.01 - 0.27 mg/dL 2.53  Sodium 664 - 403 mmol/L 133(L)  Potassium 3.5 - 5.1 mmol/L 4.2  Chloride 98 - 111 mmol/L 105  CO2 22 - 32 mmol/L 18(L)  Calcium 8.9 - 10.3 mg/dL 9.5  Total Protein 6.5 - 8.1 g/dL 6.8  Total Bilirubin 0.3 - 1.2 mg/dL 0.6  Alkaline Phos 38 - 126 U/L 137(H)  AST 15 - 41 U/L 22  ALT 0 - 44 U/L 15   Edinburgh Score: Edinburgh Postnatal Depression Scale Screening Tool 09/28/2020  I have been able to laugh and see the funny side of things. (No Data)      After visit meds:  Allergies as of 09/30/2020   No Known Allergies     Medication List    STOP taking these medications   aspirin 81 MG chewable tablet   insulin glargine 100 UNIT/ML injection Commonly known as: LANTUS   insulin glargine 100 unit/mL Sopn Commonly known as: LANTUS   IRON  PO   metFORMIN 500 MG tablet Commonly known as: GLUCOPHAGE     TAKE these medications   ibuprofen 800 MG tablet Commonly known as: ADVIL Take 1 tablet (800 mg total) by mouth every 8 (eight) hours as needed.   labetalol 100 MG tablet Commonly known as: NORMODYNE Take 100 mg by mouth 2 (two) times daily.   multivitamin-prenatal 27-0.8 MG Tabs tablet Take 1 tablet by mouth daily at 12 noon.   pantoprazole 20 MG tablet Commonly known as: PROTONIX Take 20 mg by mouth daily.   VITAMIN D (CHOLECALCIFEROL) PO Take 5,000 Units/day by mouth.        Discharge home in stable condition Infant Feeding: Bottle and Breast Infant Disposition:rooming in Discharge instruction: per After Visit Summary and Postpartum booklet. Activity: Advance as tolerated. Pelvic rest for 6  weeks.  Diet: routine diet Anticipated Birth Control: Unsure Postpartum Appointment:6 weeks Additional Postpartum F/U: 2 hour GTT Future Appointments:No future appointments. Follow up Visit:      09/30/2020 Carlisle Cater, MD

## 2020-10-01 ENCOUNTER — Ambulatory Visit: Payer: Self-pay

## 2020-10-01 NOTE — Lactation Note (Addendum)
This note was copied from a baby's chart. Lactation Consultation Note  Patient Name: Boy Jeniyah Menor [id number] Date: 10/01/2020    Infant is 65 hours old 37 weeks under Double phototherapy with 6% weight loss.  Mom states nursing going well. She is offering the breast based on cues feedings 20 minutes and supplementing with Similac Neosure 13-20 ml. Last feeding, 1 hour prior to visit 7 minutes at the breast and 21 ml of Neosure. Mom states that was the only feeding that was short and as a result she increased his supplementation. At the time of the visit, infant sleeping under lights.   Mom states nipple has been sore. She has no signs of abrasion or redness. LC gave her comfort gels to wear with instructions how to clean, store and discard after 6 days. Mom is aware not to use comfort gels with coconut oil. LC also contacted RN to get coconut oil for nipple care.   Mom has not been using the DEBP consistently. She pumped 2x yesterday. LC set up pump and got her pumping. LC reviewed with Mom importance of stimulation with pumping to maintain supply and the need for breastmilk to help clear bilirubin. Mom denies any pain with current 27 flange.   Plan 1. To feed based on cues 8-12x in 24 hour period no more than 3 hours without an attempt.          2. Mom to latch both breasts and look for signs of milk transfer, keeping infant STS with stimulation if required.          3 LPTI supplementation guide used based on hours after birth 30 ml of Neosure or breast milk with extra slow flow nipple and paced bottle feeding.           4. Mom will pump using the DEBP q 3hours for 15 minutes.            5. WIC referral completed and sent to get Mom a DEBP for home.  Mom given a Evenflow pump from Medicare.         Maryse Brierley  Nicholson-Springer 10/01/2020, 3:18 PM

## 2020-10-01 NOTE — Lactation Note (Addendum)
This note was copied from a baby's chart. Lactation Consultation Note  Patient Name: Mary Donaldson [id number] Date: 10/01/2020  P1, 54 hour ETI female infant with -7% weight loss, infant on double phototherapy . Per mom, infant was BF well earlier today, most feedings are 20 minutes in length, Last feedings since 1500 has only been formula and mom has not been using DEBP as advised. Infant was given 20 mls of formula at 23:30 pm, LC unable assess latch at this time, infant asleep on billi lights.  LC reinforced importance of mom  continuing to latch infant at breast first  to help establish her milk supply and afterwards supplement  Infant with formula for each feeding. Mom's plan: 1. BF infant no longer than 20 minutes. 2. After mom BF infant, them  Grandmother will supplement infant with formula (pace feeding)  using a slow flow bottle nipple. 3. While grandmother is formula feeding infant mom will use the DEBP on initial setting.  4. Mom will call RN or LC if she has any more questions or concerns.     Maternal Data    Feeding Feeding Type: Bottle Fed - Formula Nipple Type: Extra Slow Flow  LATCH Score                   Interventions    Lactation Tools Discussed/Used     Consult Status      Danelle Earthly 10/01/2020, 12:56 AM

## 2020-10-02 ENCOUNTER — Ambulatory Visit: Payer: Self-pay

## 2020-10-02 LAB — SURGICAL PATHOLOGY

## 2020-10-02 NOTE — Lactation Note (Signed)
This note was copied from a baby's chart. Lactation Consultation Note  Patient Name: Mary Donaldson [id number] Date: 10/02/2020 Reason for consult: Follow-up assessment  Mother requested LC for some questions regarding EBM. Per mother, she pumped and when combining EBM in on bottle noticed a clump/lump at the bottom of bottle. RN was in room and transferred EBM to a different bottle to show clump/lump. Seemed fatty clump. Removed from bottle and discarded in napkin.   Mother is very engorged and she seems to be using only the first 5 minutes of initiation setting due to sore nipples. Also, mother is not consistent with pumping. Mother states she is latching infant to breast so she skips pumping.   Reinforced the need to pump to relief breasts from engorgement. Advised mother to finish the complete initiation cycle to ensure breasts are empty and using EBM to supplement infant.   Observed mother pumping and collected ~17mL of EBM. Noted clumps/lumps at bottom of both bottles. Provided ice and instructed to use for 15 minutes at a time to help with swelling.   Infant is still receiving DPT.     Maternal Data Has patient been taught Hand Expression?: Yes  Feeding Feeding Type: Breast Fed  Interventions Interventions: Breast feeding basics reviewed;Skin to skin;Breast massage;DEBP;Expressed milk  Lactation Tools Discussed/Used Tools: Bottle;Pump;Flanges;Coconut oil Breast pump type: Double-Electric Breast Pump   Consult Status Consult Status: Follow-up Date: 10/03/20 Follow-up type: In-patient    [name] A Higuera Ancidey 10/02/2020, 5:51 PM

## 2020-10-02 NOTE — Lactation Note (Signed)
This note was copied from a baby's chart. Lactation Consultation Note  Patient Name: Mary Donaldson [id number] Date: 10/02/2020 Reason for consult: Follow-up assessment  Follow up to 56 hours old infant with 3.70% weight loss currently DPT. Mother states infant has been feeding better ~25 minutes at breast and 20 mL of bottle-fed formula. Mother reports not pumping since 5am. Mother explains her nipples are still very sore but she wants to continue putting infant to breast. Mother reports swollen and firm breasts.  Talked to mother about using ice to help with swelling and reinforced pumping. Mother states she "needs to sleep and will pump later".  Encouraged to contact [facility] for support when ready to breastfeed baby and recommended to request help for questions or concerns.    All questions answered at this time.   Feeding Feeding Type: Bottle Fed - Formula  Interventions Interventions: Breast feeding basics reviewed;Expressed milk;DEBP  Consult Status Consult Status: Follow-up Date: 10/03/20 Follow-up type: In-patient    Emilyrose Darrah A Higuera Ancidey 10/02/2020, 9:26 AM

## 2020-10-03 ENCOUNTER — Ambulatory Visit: Payer: Self-pay

## 2020-10-03 NOTE — Lactation Note (Signed)
This note was copied from a baby's chart. Lactation Consultation Note  Patient Name: Mary Donaldson [id number] Date: 10/03/2020  P1, ETI  female infant with -3% weight loss previously infant had -7% weight loss at 2 days of life, infant is on phototherapy which is at low intermediate risk today ( 13.5). Per mom, she has not be latching infant for all feedings her choice, when infant latches  he BF for 20 minutes most feedings. LC entered room and obseved mom's breast are severely engorged, mom kept tell LC she needs her rest and wants to sleep. LC discussed the importance  prevent engorgement,  if breast are really full mom need to latch infant at breast or pump.   Mom still not compliant with recommended pumping she sometimes pump 2 hours or 4 hours. Per mom, she latched infant only 4 times to the breast yesterday this was mom's choice not to latch infant with every feeding, she has been pumping and giving back her EBM to infant in slow flow bottle nipple. Mom denies any pain when infant latches at the breast.  Mom informed LC infant took 30 mls of EBM 1 hour prior to [facility] entering the room and it wasn't time to feed infant, infant was cuing to feed in basinet and started to cry. LC discussed with mom to BF or supplement EBM  by  Infant cues  And not looking at the clock ,not to feed by demand, and do not put  time constraints on infant feds when breastfeeding. LC advised mom do not go past 3 hours without feeding infant. Mom gave infant additional EBM 27 mls which infant took and appeared content afterwards.  Mom has been giving infant her EBM and sometimes formula, LC advised mom to give infant 45 mls per feeding when not latching infant at the  breast. Mom understands by day 7 infant's intake is 45 to 60 mls per feeding if she is giving infant her EBM.  LC reinforced to pump every 3 hours for 15 minutes and to ice afterwards, due to mom's  recent hx of engorgement. LC assisted mom with  alleviating current engorgement with hands on pumping and breast compressions while using DEBP. Mom expressed 100 mls with this pumping session and had 80 mls of EBM in fridge, LC put 44 mls of mom's EBM in 4 bottles, leaving one out for the next feeding. Mom will call RN or LC if she has any further questions, concerns or need assistance with latching infant at the breast.  Mom's current plan:  1. Mom will latch infant at breast according to cues, 8 to 12+ times within 24 hours, if she doesn't latch infant she will use the DEBP. 2. Mom will offer her EBM first before offering formula. 3. Based on infant's age / life  ( Day 5) mom will give infant 45 mls of EBM for each feeding or more if infant is still cuing. 4. Mom understands at one week of age if she is giving pumped EBM infant's intake is 45 or 60 mls per feeding. 5. Mom will breast, hand express or pump if she feels her breast are becoming overly full to prevent engorgement , lC reviewed with mom, engorgement treatment and prevent. 6. Mom will follow up with LC at her Physician's office once discharged from hospital    Maternal Data    Feeding    LATCH Score  Interventions    Lactation Tools Discussed/Used     Consult Status      Danelle Earthly 10/03/2020, 11:06 AM

## 2020-10-07 ENCOUNTER — Ambulatory Visit: Payer: Self-pay

## 2020-10-07 NOTE — Lactation Note (Signed)
This note was copied from a baby's chart. Lactation Consultation Note  Patient Name: Mary Donaldson [id number] Date: 10/07/2020  Mom feeding infant bottle on arrival.  Baby Mary Benard now 88 days old. Mom reports he will latch but she doesn't know how much he is getting.  Discussed transitioning him to breastfeeding if that's what she wants to do.    Discussed offering breast first and then the bottle.  Discussed breasts should soften during feed and he should take less and less supplement from the bottle as he gets better and better with breastfeeding. Discussed massage/compression while pumping and feeding.   Discussed recommended output for age.  Mom is pumping about 90 ml per pumping session.  Discussed making sure to pump at a minimum 8 times day and she could pump more 8--12 times a day while working on a healthy milk supply for later.   Mom reports she has an Evenflo DEBP for home use.   Mom had questions regarding breastmilk storage.  Answered all questions/concerns.  Praised breastmilk feeding.  Urged mom to call lactation as needed.     Maternal Data    Feeding Feeding Type: Bottle Fed - Breast Milk  LATCH Score                   Interventions    Lactation Tools Discussed/Used     Consult Status      Harman Ferrin Michaelle Copas 10/07/2020, 12:07 PM

## 2020-10-07 NOTE — Lactation Note (Signed)
This note was copied from a baby's chart. Lactation Consultation Note  Patient Name: Mary Donaldson Date: 10/07/2020 Reason for consult: Follow-up assessment Dad requested to see lactation went to see parents in room.  Mom reported they are good.  Dad reports she would like several days worth of feeding logs. Gave dad several days worth of feeding logs as requested. Urged to call lactation as needed  Maternal Data    Feeding Feeding Type: Bottle Fed - Breast Milk Nipple Type: Extra Slow Flow  LATCH Score                   Interventions Interventions: Breast compression;Expressed milk;DEBP;Hand express;Breast massage  Lactation Tools Discussed/Used     Consult Status Consult Status: Complete Date: 10/07/20 Follow-up type: Call as needed    [facility] 10/07/2020, 4:01 PM

## 2023-09-14 ENCOUNTER — Other Ambulatory Visit: Payer: Self-pay

## 2023-09-14 DIAGNOSIS — E1122 Type 2 diabetes mellitus with diabetic chronic kidney disease: Secondary | ICD-10-CM

## 2023-09-14 DIAGNOSIS — E1165 Type 2 diabetes mellitus with hyperglycemia: Secondary | ICD-10-CM

## 2023-09-16 ENCOUNTER — Other Ambulatory Visit: Payer: Medicaid Other

## 2023-09-23 ENCOUNTER — Ambulatory Visit: Payer: Medicaid Other | Admitting: "Endocrinology
